# Patient Record
Sex: Female | Born: 1955 | Race: White | Hispanic: No | Marital: Married | Smoking: Never smoker
Health system: Southern US, Community
[De-identification: ages and names within clinical notes are randomized; demographics above are authoritative.]

## PROBLEM LIST (undated history)

## (undated) DIAGNOSIS — K219 Gastro-esophageal reflux disease without esophagitis: Secondary | ICD-10-CM

## (undated) DIAGNOSIS — E785 Hyperlipidemia, unspecified: Secondary | ICD-10-CM

## (undated) DIAGNOSIS — I10 Essential (primary) hypertension: Secondary | ICD-10-CM

## (undated) DIAGNOSIS — I639 Cerebral infarction, unspecified: Secondary | ICD-10-CM

## (undated) DIAGNOSIS — M1990 Unspecified osteoarthritis, unspecified site: Secondary | ICD-10-CM

## (undated) DIAGNOSIS — E079 Disorder of thyroid, unspecified: Secondary | ICD-10-CM

## (undated) HISTORY — PX: PLACEMENT OF BREAST IMPLANTS: SHX6334

## (undated) HISTORY — PX: ABDOMINAL HYSTERECTOMY: SHX81

---

## 2017-11-28 HISTORY — PX: ROBOTIC ASSISTED LAPAROSCOPIC SACROCOLPOPEXY: SHX5388

## 2018-09-11 ENCOUNTER — Emergency Department (HOSPITAL_COMMUNITY): Payer: No Typology Code available for payment source

## 2018-09-11 ENCOUNTER — Other Ambulatory Visit: Payer: Self-pay

## 2018-09-11 ENCOUNTER — Inpatient Hospital Stay (HOSPITAL_COMMUNITY)
Admission: EM | Admit: 2018-09-11 | Discharge: 2018-09-17 | DRG: 330 | Disposition: A | Discharge status: Home / Self Care | Payer: No Typology Code available for payment source | Attending: General Surgery | Admitting: General Surgery

## 2018-09-11 ENCOUNTER — Encounter (HOSPITAL_COMMUNITY)
Admission: EM | Disposition: A | Discharge status: Home / Self Care | Payer: Self-pay | Source: Home / Self Care | Attending: General Surgery

## 2018-09-11 ENCOUNTER — Encounter (HOSPITAL_COMMUNITY): Payer: Self-pay | Admitting: Emergency Medicine

## 2018-09-11 DIAGNOSIS — Z8249 Family history of ischemic heart disease and other diseases of the circulatory system: Secondary | ICD-10-CM

## 2018-09-11 DIAGNOSIS — K4030 Unilateral inguinal hernia, with obstruction, without gangrene, not specified as recurrent: Principal | ICD-10-CM | POA: Diagnosis present

## 2018-09-11 DIAGNOSIS — K567 Ileus, unspecified: Secondary | ICD-10-CM | POA: Diagnosis not present

## 2018-09-11 DIAGNOSIS — E039 Hypothyroidism, unspecified: Secondary | ICD-10-CM | POA: Diagnosis present

## 2018-09-11 DIAGNOSIS — Z881 Allergy status to other antibiotic agents status: Secondary | ICD-10-CM

## 2018-09-11 DIAGNOSIS — I10 Essential (primary) hypertension: Secondary | ICD-10-CM | POA: Diagnosis present

## 2018-09-11 DIAGNOSIS — K56609 Unspecified intestinal obstruction, unspecified as to partial versus complete obstruction: Secondary | ICD-10-CM | POA: Diagnosis not present

## 2018-09-11 DIAGNOSIS — R109 Unspecified abdominal pain: Secondary | ICD-10-CM

## 2018-09-11 DIAGNOSIS — K219 Gastro-esophageal reflux disease without esophagitis: Secondary | ICD-10-CM | POA: Diagnosis present

## 2018-09-11 DIAGNOSIS — Z4659 Encounter for fitting and adjustment of other gastrointestinal appliance and device: Secondary | ICD-10-CM

## 2018-09-11 DIAGNOSIS — Z808 Family history of malignant neoplasm of other organs or systems: Secondary | ICD-10-CM

## 2018-09-11 DIAGNOSIS — Z803 Family history of malignant neoplasm of breast: Secondary | ICD-10-CM

## 2018-09-11 DIAGNOSIS — Z882 Allergy status to sulfonamides status: Secondary | ICD-10-CM

## 2018-09-11 DIAGNOSIS — K4090 Unilateral inguinal hernia, without obstruction or gangrene, not specified as recurrent: Secondary | ICD-10-CM

## 2018-09-11 DIAGNOSIS — Z20828 Contact with and (suspected) exposure to other viral communicable diseases: Secondary | ICD-10-CM | POA: Diagnosis present

## 2018-09-11 DIAGNOSIS — Z8673 Personal history of transient ischemic attack (TIA), and cerebral infarction without residual deficits: Secondary | ICD-10-CM

## 2018-09-11 DIAGNOSIS — E876 Hypokalemia: Secondary | ICD-10-CM | POA: Diagnosis not present

## 2018-09-11 HISTORY — PX: BOWEL RESECTION: SHX1257

## 2018-09-11 HISTORY — PX: INGUINAL HERNIA REPAIR: SHX194

## 2018-09-11 HISTORY — DX: Unspecified osteoarthritis, unspecified site: M19.90

## 2018-09-11 HISTORY — DX: Essential (primary) hypertension: I10

## 2018-09-11 HISTORY — DX: Disorder of thyroid, unspecified: E07.9

## 2018-09-11 HISTORY — DX: Gastro-esophageal reflux disease without esophagitis: K21.9

## 2018-09-11 HISTORY — DX: Cerebral infarction, unspecified: I63.9

## 2018-09-11 LAB — URINALYSIS, ROUTINE W REFLEX MICROSCOPIC
Bacteria, UA: NONE SEEN
Bilirubin Urine: NEGATIVE
Glucose, UA: NEGATIVE mg/dL
Hgb urine dipstick: NEGATIVE
Ketones, ur: 80 mg/dL — AB
Leukocytes,Ua: NEGATIVE
Nitrite: NEGATIVE
Protein, ur: 30 mg/dL — AB
Specific Gravity, Urine: 1.026 (ref 1.005–1.030)
pH: 5 (ref 5.0–8.0)

## 2018-09-11 LAB — COMPREHENSIVE METABOLIC PANEL
ALT: 29 U/L (ref 0–44)
AST: 25 U/L (ref 15–41)
Albumin: 4.3 g/dL (ref 3.5–5.0)
Alkaline Phosphatase: 126 U/L (ref 38–126)
Anion gap: 14 (ref 5–15)
BUN: 21 mg/dL (ref 8–23)
CO2: 27 mmol/L (ref 22–32)
Calcium: 9.5 mg/dL (ref 8.9–10.3)
Chloride: 99 mmol/L (ref 98–111)
Creatinine, Ser: 0.47 mg/dL (ref 0.44–1.00)
GFR calc Af Amer: >60 mL/min (ref >60)
GFR calc non Af Amer: >60 mL/min (ref >60)
Glucose, Bld: 124 mg/dL — ABNORMAL HIGH (ref 70–99)
Potassium: 3.3 mmol/L — ABNORMAL LOW (ref 3.5–5.1)
Sodium: 140 mmol/L (ref 135–145)
Total Bilirubin: 0.9 mg/dL (ref 0.3–1.2)
Total Protein: 7.5 g/dL (ref 6.5–8.1)

## 2018-09-11 LAB — TROPONIN I (HIGH SENSITIVITY)
Troponin I (High Sensitivity): 4 ng/L (ref <18)
Troponin I (High Sensitivity): 4 ng/L (ref <18)

## 2018-09-11 LAB — DIFFERENTIAL
Abs Immature Granulocytes: 0.08 10*3/uL — ABNORMAL HIGH (ref 0.00–0.07)
Basophils Absolute: 0 10*3/uL (ref 0.0–0.1)
Basophils Relative: 0 %
Eosinophils Absolute: 0 10*3/uL (ref 0.0–0.5)
Eosinophils Relative: 0 %
Immature Granulocytes: 1 %
Lymphocytes Relative: 8 %
Lymphs Abs: 1 10*3/uL (ref 0.7–4.0)
Monocytes Absolute: 0.3 10*3/uL (ref 0.1–1.0)
Monocytes Relative: 3 %
Neutro Abs: 11.1 10*3/uL — ABNORMAL HIGH (ref 1.7–7.7)
Neutrophils Relative %: 88 %

## 2018-09-11 LAB — CBC
HCT: 48.9 % — ABNORMAL HIGH (ref 36.0–46.0)
Hemoglobin: 16 g/dL — ABNORMAL HIGH (ref 12.0–15.0)
MCH: 31.9 pg (ref 26.0–34.0)
MCHC: 32.7 g/dL (ref 30.0–36.0)
MCV: 97.4 fL (ref 80.0–100.0)
Platelets: 321 10*3/uL (ref 150–400)
RBC: 5.02 MIL/uL (ref 3.87–5.11)
RDW: 14 % (ref 11.5–15.5)
WBC: 12.2 10*3/uL — ABNORMAL HIGH (ref 4.0–10.5)
nRBC: 0 % (ref 0.0–0.2)

## 2018-09-11 LAB — SARS CORONAVIRUS 2 BY RT PCR (HOSPITAL ORDER, PERFORMED IN ~~LOC~~ HOSPITAL LAB): SARS Coronavirus 2: NEGATIVE

## 2018-09-11 LAB — LIPASE, BLOOD: Lipase: 17 U/L (ref 11–51)

## 2018-09-11 SURGERY — REPAIR, HERNIA, INGUINAL, INCARCERATED
Anesthesia: General | Site: Abdomen | Laterality: Left

## 2018-09-11 MED ORDER — BUPIVACAINE HCL (PF) 0.5 % IJ SOLN
INTRAMUSCULAR | Status: AC
  Filled 2018-09-11: qty 30

## 2018-09-11 MED ORDER — ONDANSETRON HCL 4 MG/2ML IJ SOLN
4.0000 mg | INTRAMUSCULAR | Status: AC | PRN
  Administered 2018-09-11 (×2): 4 mg via INTRAVENOUS
  Filled 2018-09-11 (×2): qty 2

## 2018-09-11 MED ORDER — IOHEXOL 300 MG/ML  SOLN
100.0000 mL | Freq: Once | INTRAMUSCULAR | Status: AC | PRN
  Administered 2018-09-11: 100 mL via INTRAVENOUS

## 2018-09-11 MED ORDER — MORPHINE SULFATE (PF) 4 MG/ML IV SOLN
4.0000 mg | INTRAVENOUS | Status: DC | PRN
  Administered 2018-09-11: 4 mg via INTRAVENOUS
  Filled 2018-09-11: qty 1

## 2018-09-11 MED ORDER — SUCCINYLCHOLINE CHLORIDE 200 MG/10ML IV SOSY
PREFILLED_SYRINGE | INTRAVENOUS | Status: AC
  Filled 2018-09-11: qty 10

## 2018-09-11 MED ORDER — BUPIVACAINE LIPOSOME 1.3 % IJ SUSP
INTRAMUSCULAR | Status: AC
  Filled 2018-09-11: qty 20

## 2018-09-11 MED ORDER — SODIUM CHLORIDE 0.9 % IV SOLN
INTRAVENOUS | Status: DC
  Administered 2018-09-11: 21:00:00 via INTRAVENOUS

## 2018-09-11 MED ORDER — PROPOFOL 10 MG/ML IV BOLUS
INTRAVENOUS | Status: AC
  Filled 2018-09-11: qty 20

## 2018-09-11 MED ORDER — ROCURONIUM BROMIDE 10 MG/ML (PF) SYRINGE
PREFILLED_SYRINGE | INTRAVENOUS | Status: AC
  Filled 2018-09-11: qty 10

## 2018-09-11 MED ORDER — FENTANYL CITRATE (PF) 100 MCG/2ML IJ SOLN
50.0000 ug | INTRAMUSCULAR | Status: DC | PRN
  Administered 2018-09-11: 50 ug via INTRAVENOUS

## 2018-09-11 MED ORDER — FENTANYL CITRATE (PF) 250 MCG/5ML IJ SOLN
INTRAMUSCULAR | Status: AC
  Filled 2018-09-11: qty 5

## 2018-09-11 MED ORDER — SODIUM CHLORIDE 0.9 % IV BOLUS
500.0000 mL | Freq: Once | INTRAVENOUS | Status: AC
  Administered 2018-09-11: 500 mL via INTRAVENOUS

## 2018-09-11 MED ORDER — FAMOTIDINE IN NACL 20-0.9 MG/50ML-% IV SOLN
20.0000 mg | Freq: Once | INTRAVENOUS | Status: AC
  Administered 2018-09-11: 20 mg via INTRAVENOUS
  Filled 2018-09-11: qty 50

## 2018-09-11 MED ORDER — ONDANSETRON HCL 4 MG/2ML IJ SOLN
INTRAMUSCULAR | Status: AC
  Filled 2018-09-11: qty 2

## 2018-09-11 MED ORDER — FENTANYL CITRATE (PF) 100 MCG/2ML IJ SOLN
50.0000 ug | Freq: Once | INTRAMUSCULAR | Status: DC
  Filled 2018-09-11: qty 2

## 2018-09-11 SURGICAL SUPPLY — 48 items
CELLS DAT CNTRL 66122 CELL SVR (MISCELLANEOUS) ×2 IMPLANT
CLOTH BEACON ORANGE TIMEOUT ST (SAFETY) ×2 IMPLANT
COVER LIGHT HANDLE STERIS (MISCELLANEOUS) ×4 IMPLANT
COVER WAND RF STERILE (DRAPES) ×2 IMPLANT
DRSG OPSITE POSTOP 4X8 (GAUZE/BANDAGES/DRESSINGS) ×2 IMPLANT
ELECT REM PT RETURN 9FT ADLT (ELECTROSURGICAL) ×4
ELECTRODE REM PT RTRN 9FT ADLT (ELECTROSURGICAL) IMPLANT
GLOVE BIO SURGEON STRL SZ 6.5 (GLOVE) ×1 IMPLANT
GLOVE BIO SURGEON STRL SZ7 (GLOVE) ×2 IMPLANT
GLOVE BIO SURGEONS STRL SZ 6.5 (GLOVE) ×1
GLOVE BIOGEL PI IND STRL 6.5 (GLOVE) IMPLANT
GLOVE BIOGEL PI IND STRL 7.0 (GLOVE) IMPLANT
GLOVE BIOGEL PI INDICATOR 6.5 (GLOVE) ×4
GLOVE BIOGEL PI INDICATOR 7.0 (GLOVE) ×8
GOWN STRL REUS W/TWL LRG LVL3 (GOWN DISPOSABLE) ×4 IMPLANT
INST SET MINOR GENERAL (KITS) ×2 IMPLANT
KIT TURNOVER KIT A (KITS) ×2 IMPLANT
LIGASURE IMPACT 36 18CM CVD LR (INSTRUMENTS) ×2 IMPLANT
MANIFOLD NEPTUNE II (INSTRUMENTS) ×2 IMPLANT
NDL HYPO 18GX1.5 BLUNT FILL (NEEDLE) IMPLANT
NDL HYPO 21X1.5 SAFETY (NEEDLE) IMPLANT
NEEDLE HYPO 18GX1.5 BLUNT FILL (NEEDLE) ×4 IMPLANT
NEEDLE HYPO 21X1.5 SAFETY (NEEDLE) ×4 IMPLANT
NEEDLE HYPO 22GX1.5 SAFETY (NEEDLE) ×2 IMPLANT
NS IRRIG 1000ML POUR BTL (IV SOLUTION) ×2 IMPLANT
PACK MINOR (CUSTOM PROCEDURE TRAY) ×2 IMPLANT
PAD ARMBOARD 7.5X6 YLW CONV (MISCELLANEOUS) ×2 IMPLANT
RELOAD LINEAR CUT PROX 55 BLUE (ENDOMECHANICALS) ×8 IMPLANT
RELOAD STAPLE 55 3.8 BLU REG (ENDOMECHANICALS) IMPLANT
RETRACTOR WND ALEXIS 18 MED (MISCELLANEOUS) IMPLANT
RTRCTR WOUND ALEXIS 18CM MED (MISCELLANEOUS) ×4
SET BASIN LINEN APH (SET/KITS/TRAYS/PACK) ×2 IMPLANT
STAPLER GUN LINEAR PROX 60 (STAPLE) ×2 IMPLANT
STAPLER PROXIMATE 55 BLUE (STAPLE) ×2 IMPLANT
STAPLER VISISTAT (STAPLE) ×2 IMPLANT
SUT ETHIBOND 0 MO6 C/R (SUTURE) ×2 IMPLANT
SUT PDS AB CT VIOLET #0 27IN (SUTURE) ×4 IMPLANT
SUT SILK 3 0 SH CR/8 (SUTURE) ×2 IMPLANT
SUT VIC AB 2-0 CT1 27 (SUTURE) ×2
SUT VIC AB 2-0 CT1 TAPERPNT 27 (SUTURE) IMPLANT
SUT VIC AB 3-0 SH 27 (SUTURE) ×2
SUT VIC AB 3-0 SH 27X BRD (SUTURE) IMPLANT
SUT VICRYL AB 3 0 TIES (SUTURE) ×2 IMPLANT
SYR 20ML LL LF (SYRINGE) ×4 IMPLANT
TOWEL OR 17X26 4PK STRL BLUE (TOWEL DISPOSABLE) ×2 IMPLANT
TRAY FOLEY W/BAG SLVR 14FR (SET/KITS/TRAYS/PACK) ×2 IMPLANT
TRAY FOLEY W/BAG SLVR 16FR (SET/KITS/TRAYS/PACK) ×2
TRAY FOLEY W/BAG SLVR 16FR ST (SET/KITS/TRAYS/PACK) IMPLANT

## 2018-09-11 NOTE — ED Triage Notes (Signed)
Nausea and all over abd pain started yesterday, reports vomiting that started this morning x 12. Took oral zofran at 1500 with no relief.

## 2018-09-11 NOTE — Anesthesia Preprocedure Evaluation (Signed)
Anesthesia Evaluation  Patient identified by MRN, date of birth, ID band Patient awake    Reviewed: Allergy & Precautions, NPO status , Patient's Chart, lab work & pertinent test results  Airway Mallampati: II  TM Distance: >3 FB Neck ROM: Full    Dental no notable dental hx. (+) Teeth Intact   Pulmonary neg pulmonary ROS,    Pulmonary exam normal breath sounds clear to auscultation       Cardiovascular Exercise Tolerance: Good hypertension, Pt. on medications negative cardio ROS Normal cardiovascular examI Rhythm:Regular Rate:Normal  States good ET , but limited by LBP and arthritis issues    Neuro/Psych Reports small CVA noted on scan in past -states was unaware until told it showed up on scan  CVA, No Residual Symptoms negative psych ROS   GI/Hepatic Neg liver ROS, GERD  Medicated and Controlled,  Endo/Other  Hypothyroidism   Renal/GU negative Renal ROS  negative genitourinary   Musculoskeletal  (+) Arthritis , Osteoarthritis,    Abdominal   Peds negative pediatric ROS (+)  Hematology negative hematology ROS (+)   Anesthesia Other Findings   Reproductive/Obstetrics negative OB ROS                             Anesthesia Physical Anesthesia Plan  ASA: II and emergent  Anesthesia Plan: General   Post-op Pain Management:    Induction: Intravenous  PONV Risk Score and Plan: 3 and Dexamethasone, Ondansetron, Treatment may vary due to age or medical condition and Promethazine  Airway Management Planned: Oral ETT  Additional Equipment:   Intra-op Plan:   Post-operative Plan: Extubation in OR  Informed Consent: I have reviewed the patients History and Physical, chart, labs and discussed the procedure including the risks, benefits and alternatives for the proposed anesthesia with the patient or authorized representative who has indicated his/her understanding and acceptance.      Dental advisory given  Plan Discussed with:   Anesthesia Plan Comments: (Plan Full PPE use  Plan GETA -RSI- wtp after Q&A  NGT in place, c/o nausea )        Anesthesia Quick Evaluation

## 2018-09-11 NOTE — ED Provider Notes (Signed)
[facility] EMERGENCY DEPARTMENT Provider Note   CSN: [id number] Arrival date & time: 09/11/18  1633     History   Chief Complaint Chief Complaint  Patient presents with   Nausea    HPI Claire Knight is a 63 y.o. female.     HPI  Pt was seen at 1915.  Per pt, c/o gradual onset and persistence of constant generalized abd "pain" since yesterday.  Has been associated with multiple intermittent episodes of N/V.  Describes the abd pain as "cramping." Pt endorses hx of similar abd pain intermittently for several years "but it usually goes away after a while," states she did not seek medical attention for it at those times so she does not know the diagnosis.  Denies diarrhea, no fevers, no back pain, no rash, no CP/SOB, no black or blood in stools or emesis.       Past Medical History:  Diagnosis Date   Arthritis    GERD (gastroesophageal reflux disease)    Hypertension    Stroke [facility])    found old stroke on MRI recently. no deficets    Thyroid disease     There are no active problems to display for this patient.   Past Surgical History:  Procedure Laterality Date   ABDOMINAL HYSTERECTOMY     SMALL INTESTINE SURGERY       OB History   No obstetric history on file.      Home Medications    Prior to Admission medications   Not on File    Family History No family history on file.  Social History Social History   Tobacco Use   Smoking status: Never Smoker   Smokeless tobacco: Never Used  Substance Use Topics   Alcohol use: Yes    Comment: occ   Drug use: Never     Allergies   Clindamycin/lincomycin and Sulfa antibiotics   Review of Systems Review of Systems ROS: Statement: All systems negative except as marked or noted in the HPI; Constitutional: Negative for fever and chills. ; ; Eyes: Negative for eye pain, redness and discharge. ; ; ENMT: Negative for ear pain, hoarseness, nasal congestion, sinus pressure and sore throat. ; ;  Cardiovascular: Negative for chest pain, palpitations, diaphoresis, dyspnea and peripheral edema. ; ; Respiratory: Negative for cough, wheezing and stridor. ; ; Gastrointestinal: +N/V, abd pain. Negative for diarrhea, blood in stool, hematemesis, jaundice and rectal bleeding. . ; ; Genitourinary: Negative for dysuria, flank pain and hematuria. ; ; Musculoskeletal: Negative for back pain and neck pain. Negative for swelling and trauma.; ; Skin: Negative for pruritus, rash, abrasions, blisters, bruising and skin lesion.; ; Neuro: Negative for headache, lightheadedness and neck stiffness. Negative for weakness, altered level of consciousness, altered mental status, extremity weakness, paresthesias, involuntary movement, seizure and syncope.       Physical Exam Updated Vital Signs BP (!) 150/88 (BP Location: Right Arm)    Pulse 86    Temp 98.3 F (36.8 C) (Oral)    Resp 19    Ht 5\' 2"  (1.575 m)    Wt 72.6 kg    SpO2 99%    BMI 29.26 kg/m   Physical Exam 1920: Physical examination:  Nursing notes reviewed; Vital signs and O2 SAT reviewed;  Constitutional: Well developed, Well nourished, Well hydrated, In no acute distress; Head:  Normocephalic, atraumatic; Eyes: EOMI, PERRL, No scleral icterus; ENMT: Mouth and pharynx normal, Mucous membranes moist; Neck: Supple, Full range of motion, No lymphadenopathy; Cardiovascular: Regular rate  and rhythm, No gallop; Respiratory: Breath sounds clear & equal bilaterally, No wheezes.  Speaking full sentences with ease, Normal respiratory effort/excursion; Chest: Nontender, Movement normal; Abdomen: Soft, +diffuse tenderness to palp. No rebound or guarding. Nondistended, Normal bowel sounds; Genitourinary: No CVA tenderness; Extremities: Peripheral pulses normal, No tenderness, No edema, No calf edema or asymmetry.; Neuro: AA&Ox3, Major CN grossly intact.  Speech clear. No gross focal motor or sensory deficits in extremities. Climbs on and off stretcher easily by herself.  Gait steady..; Skin: Color normal, Warm, Dry.   ED Treatments / Results  Labs (all labs ordered are listed, but only abnormal results are displayed)   EKG EKG Interpretation  Date/Time:  Friday September 11 2018 19:38:14 EDT Ventricular Rate:  86 PR Interval:    QRS Duration: 94 QT Interval:  374 QTC Calculation: 448 R Axis:   -7 Text Interpretation:  Sinus rhythm Low voltage, precordial leads Baseline wander No old tracing to compare Confirmed by Samuel JesterMcManus, Shelma Eiben [phone]) on 09/11/2018 7:46:40 PM   Radiology   Procedures Procedures (including critical care time)  Medications Ordered in ED Medications  ondansetron (ZOFRAN) injection 4 mg (has no administration in time range)  morphine 4 MG/ML injection 4 mg (has no administration in time range)  0.9 %  sodium chloride infusion (has no administration in time range)  sodium chloride 0.9 % bolus 500 mL (has no administration in time range)     Initial Impression / Assessment and Plan / ED Course  I have reviewed the triage vital signs and the nursing notes.  Pertinent labs & imaging results that were available during my care of the patient were reviewed by me and considered in my medical decision making (see chart for details).     MDM Reviewed: previous chart, nursing note and vitals Reviewed previous: labs and ECG Interpretation: labs, ECG, x-ray and CT scan   Results for orders placed or performed during the hospital encounter of 09/11/18  Lipase, blood  Result Value Ref Range   Lipase 17 11 - 51 U/L  Comprehensive metabolic panel  Result Value Ref Range   Sodium 140 135 - 145 mmol/L   Potassium 3.3 (L) 3.5 - 5.1 mmol/L   Chloride 99 98 - 111 mmol/L   CO2 27 22 - 32 mmol/L   Glucose, Bld 124 (H) 70 - 99 mg/dL   BUN 21 8 - 23 mg/dL   Creatinine, Ser 6.040.47 0.44 - 1.00 mg/dL   Calcium 9.5 8.9 - 54.010.3 mg/dL   Total Protein 7.5 6.5 - 8.1 g/dL   Albumin 4.3 3.5 - 5.0 g/dL   AST 25 15 - 41 U/L   ALT 29 0 - 44 U/L     Alkaline Phosphatase 126 38 - 126 U/L   Total Bilirubin 0.9 0.3 - 1.2 mg/dL   GFR calc non Af Amer >60 >60 mL/min   GFR calc Af Amer >60 >60 mL/min   Anion gap 14 5 - 15  CBC  Result Value Ref Range   WBC 12.2 (H) 4.0 - 10.5 K/uL   RBC 5.02 3.87 - 5.11 MIL/uL   Hemoglobin 16.0 (H) 12.0 - 15.0 g/dL   HCT 98.148.9 (H) 19.136.0 - 47.846.0 %   MCV 97.4 80.0 - 100.0 fL   MCH 31.9 26.0 - 34.0 pg   MCHC 32.7 30.0 - 36.0 g/dL   RDW 29.514.0 62.111.5 - 30.815.5 %   Platelets 321 150 - 400 K/uL   nRBC 0.0 0.0 - 0.2 %  Urinalysis,  Routine w reflex microscopic  Result Value Ref Range   Color, Urine AMBER (A) YELLOW   APPearance HAZY (A) CLEAR   Specific Gravity, Urine 1.026 1.005 - 1.030   pH 5.0 5.0 - 8.0   Glucose, UA NEGATIVE NEGATIVE mg/dL   Hgb urine dipstick NEGATIVE NEGATIVE   Bilirubin Urine NEGATIVE NEGATIVE   Ketones, ur 80 (A) NEGATIVE mg/dL   Protein, ur 30 (A) NEGATIVE mg/dL   Nitrite NEGATIVE NEGATIVE   Leukocytes,Ua NEGATIVE NEGATIVE   RBC / HPF 11-20 0 - 5 RBC/hpf   WBC, UA 0-5 0 - 5 WBC/hpf   Bacteria, UA NONE SEEN NONE SEEN   Squamous Epithelial / LPF 0-5 0 - 5   Mucus PRESENT   Differential  Result Value Ref Range   Neutrophils Relative % 88 %   Neutro Abs 11.1 (H) 1.7 - 7.7 K/uL   Lymphocytes Relative 8 %   Lymphs Abs 1.0 0.7 - 4.0 K/uL   Monocytes Relative 3 %   Monocytes Absolute 0.3 0.1 - 1.0 K/uL   Eosinophils Relative 0 %   Eosinophils Absolute 0.0 0.0 - 0.5 K/uL   Basophils Relative 0 %   Basophils Absolute 0.0 0.0 - 0.1 K/uL   Immature Granulocytes 1 %   Abs Immature Granulocytes 0.08 (H) 0.00 - 0.07 K/uL  Troponin I (High Sensitivity)  Result Value Ref Range   Troponin I (High Sensitivity) 4 <18 ng/L   Ct Abdomen Pelvis W Contrast Result Date: 09/11/2018 CLINICAL DATA:  Abdominal pain EXAM: CT ABDOMEN AND PELVIS WITH CONTRAST TECHNIQUE: Multidetector CT imaging of the abdomen and pelvis was performed using the standard protocol following bolus administration of  intravenous contrast. CONTRAST:  100mL OMNIPAQUE IOHEXOL 300 MG/ML  SOLN COMPARISON:  None. FINDINGS: Lower chest: Lung bases demonstrate no acute consolidation or effusion. Heart size within normal limits. Bilateral breast implants, diffusely calcified on the left. Large para esophageal hiatal hernia. Hepatobiliary: No focal liver abnormality is seen. No gallstones, gallbladder wall thickening, or biliary dilatation. Pancreas: Unremarkable. No pancreatic ductal dilatation or surrounding inflammatory changes. Spleen: Normal in size without focal abnormality. Adrenals/Urinary Tract: Adrenal glands are unremarkable. Kidneys are normal, without renal calculi, focal lesion, or hydronephrosis. Bladder is unremarkable. Stomach/Bowel: Dilated fluid-filled loops of mid small bowel, measuring up to 3 cm. Transition point related to a left inguinal hernia containing mesentery and segment of small bowel. There is edema and fluid in the hernia sac. The bowel distal to the hernia is decompressed. Sigmoid colon diverticula without acute inflammatory change Vascular/Lymphatic: Nonaneurysmal aorta. Mild aortic atherosclerosis. No significantly enlarged lymph nodes. Reproductive: Status post hysterectomy. No adnexal masses. Other: Negative for free air or free fluid Musculoskeletal: Grade 1 anterolisthesis L4 on L5. Degenerative changes. No acute osseous abnormality IMPRESSION: 1. Findings consistent with mechanical small bowel obstruction, secondary to an incarcerated left inguinal hernia. 2. Large hiatal hernia 3. Sigmoid colon diverticular disease without acute inflammatory change Electronically Signed   By: Jasmine PangKim  Fujinaga M.D.   On: 09/11/2018 21:33   Dg Chest Port 1 View Result Date: 09/11/2018 CLINICAL DATA:  Abdomen pain with nausea and vomiting EXAM: PORTABLE CHEST 1 VIEW COMPARISON:  None. FINDINGS: The heart size and mediastinal contours are within normal limits. Both lungs are clear. The visualized skeletal structures  are unremarkable. IMPRESSION: No active disease. Electronically Signed   By: Jasmine PangKim  Fujinaga M.D.   On: 09/11/2018 20:17   Claire Knight was evaluated in Emergency Department on 09/11/2018 for the symptoms described in the  history of present illness. She was evaluated in the context of the global COVID-19 pandemic, which necessitated consideration that the patient might be at risk for infection with the SARS-CoV-2 virus that causes COVID-19. Institutional protocols and algorithms that pertain to the evaluation of patients at risk for COVID-19 are in a state of rapid change based on information released by regulatory bodies including the CDC and federal and state organizations. These policies and algorithms were followed during the patient's care in the ED.   2155:  IV morphine and fentanyl given for pain. No N/V while in the ED.  CT as above. Attempted to reduce hernia with gentle pressure: unable without pt yelling and pushing my hand away (despite meds and ice pack). Pt now states "that's been sticking out for years" and she "never pushes it in" and she has never sought medical attention for it.  T/C returned from General Surgery Dr. Henreitta LeberBridges, case discussed, including:  HPI, pertinent PM/SHx, VS/PE, dx testing, ED course and treatment:  requests to place NGT, obtain COVID testing and will come to ED for evaluation/likely take pt to OR.      Final Clinical Impressions(s) / ED Diagnoses   Final diagnoses:  Abdominal pain    ED Discharge Orders    None       Samuel JesterMcManus, Lanayah Gartley, DO 09/16/18 1255

## 2018-09-11 NOTE — H&P (Signed)
[facility] History and Physical  Reason for Referral: Incarcerated Left inguinal hernia with large bowel obstruction  Referring Physician:  Dr. Thurnell Garbe   Chief Complaint    Nausea      Claire Knight is a 63 y.o. female.  HPI: Claire Knight is a 63 yo female with GERD, HTN, and otherwise relatively healthy who comes in with lower abdominal, left groin pain with associated nausea/ vomiting. She reports that she has had some similar episodes in the past but these resolve. She has never felt a knot or bulge that she can recall. She underwent robotic sacrocolopexy in November 2019 at [facility] and did well after this surgery.   The ED attempted to reduce the hernia with pain medication, but this was not successful.  Daughter Claire Knight is at her bedside.   Past Medical History:  Diagnosis Date  . Arthritis   . GERD (gastroesophageal reflux disease)   . Hypertension   . Stroke [facility])    found old stroke on MRI recently. no deficets   . Thyroid disease     Past Surgical History:  Procedure Laterality Date  . ABDOMINAL HYSTERECTOMY    . PLACEMENT OF BREAST IMPLANTS    . ROBOTIC ASSISTED LAPAROSCOPIC SACROCOLPOPEXY  11/2017    Family History  Problem Relation Age of Onset  . CAD Father   . Heart failure Father   . Breast cancer Sister   . Brain cancer Brother     Social History   Tobacco Use  . Smoking status: Never Smoker  . Smokeless tobacco: Never Used  Substance Use Topics  . Alcohol use: Yes    Comment: occ  . Drug use: Never    Medications:  I have reviewed the patient's current medications. Prior to Admission: (Not in a hospital admission)  Scheduled: . fentaNYL (SUBLIMAZE) injection  50 mcg Intravenous Once   Continuous: . sodium chloride 100 mL/hr at 09/11/18 2030   LFY:BOFBPZWC (SUBLIMAZE) injection, morphine injection, ondansetron  Allergies  Allergen Reactions  . Clindamycin/Lincomycin Other (See Comments)    Body aches   . Sulfa  Antibiotics Other (See Comments)    Hallucinations    ROS:  A comprehensive review of systems was negative except for: Gastrointestinal: positive for abdominal pain, nausea and vomiting  Blood pressure 139/83, pulse 83, temperature 98.3 F (36.8 C), temperature source Oral, resp. rate 11, height 5\' 2"  (1.575 m), weight 72.6 kg, SpO2 96 %. Physical Exam Vitals signs reviewed.  HENT:     Head: Normocephalic.     Nose: Nose normal.     Mouth/Throat:     Mouth: Mucous membranes are dry.  Eyes:     Extraocular Movements: Extraocular movements intact.     Pupils: Pupils are equal, round, and reactive to light.  Neck:     Musculoskeletal: Normal range of motion.  Cardiovascular:     Rate and Rhythm: Normal rate.  Pulmonary:     Effort: Pulmonary effort is normal.  Abdominal:     General: There is distension.     Palpations: Abdomen is soft.     Tenderness: There is abdominal tenderness.     Hernia: A hernia is present. Hernia is present in the left inguinal area.     Comments: Hard tender area over left inguinal region  Musculoskeletal:        General: No swelling.  Skin:    General: Skin is warm and dry.  Neurological:     General: No focal deficit present.  Mental Status: She is alert and oriented to person, place, and time.  Psychiatric:        Mood and Affect: Mood normal.        Behavior: Behavior normal.        Thought Content: Thought content normal.        Judgment: Judgment normal.     Results: Results for orders placed or performed during the hospital encounter of 09/11/18 (from the past 48 hour(s))  Lipase, blood     Status: None   Collection Time: 09/11/18  6:30 PM  Result Value Ref Range   Lipase 17 11 - 51 U/L    Comment: Performed at [facility], [address] [place], [place] [address]  Comprehensive metabolic panel     Status: Abnormal   Collection Time: 09/11/18  6:30 PM  Result Value Ref Range   Sodium 140 135 - 145 mmol/L   Potassium 3.3 (L)  3.5 - 5.1 mmol/L   Chloride 99 98 - 111 mmol/L   CO2 27 22 - 32 mmol/L   Glucose, Bld 124 (H) 70 - 99 mg/dL   BUN 21 8 - 23 mg/dL   Creatinine, Ser 1.910.47 0.44 - 1.00 mg/dL   Calcium 9.5 8.9 - 47.810.3 mg/dL   Total Protein 7.5 6.5 - 8.1 g/dL   Albumin 4.3 3.5 - 5.0 g/dL   AST 25 15 - 41 U/L   ALT 29 0 - 44 U/L   Alkaline Phosphatase 126 38 - 126 U/L   Total Bilirubin 0.9 0.3 - 1.2 mg/dL   GFR calc non Af Amer >60 >60 mL/min   GFR calc Af Amer >60 >60 mL/min   Anion gap 14 5 - 15    Comment: Performed at [facility], [address] [place], [place] [address]  CBC     Status: Abnormal   Collection Time: 09/11/18  6:30 PM  Result Value Ref Range   WBC 12.2 (H) 4.0 - 10.5 K/uL   RBC 5.02 3.87 - 5.11 MIL/uL   Hemoglobin 16.0 (H) 12.0 - 15.0 g/dL   HCT 13.048.9 (H) 86.536.0 - 78.446.0 %   MCV 97.4 80.0 - 100.0 fL   MCH 31.9 26.0 - 34.0 pg   MCHC 32.7 30.0 - 36.0 g/dL   RDW 69.614.0 29.511.5 - 28.415.5 %   Platelets 321 150 - 400 K/uL   nRBC 0.0 0.0 - 0.2 %    Comment: Performed at [facility], [address] [place], [place] [address]  Differential     Status: Abnormal   Collection Time: 09/11/18  6:30 PM  Result Value Ref Range   Neutrophils Relative % 88 %   Neutro Abs 11.1 (H) 1.7 - 7.7 K/uL   Lymphocytes Relative 8 %   Lymphs Abs 1.0 0.7 - 4.0 K/uL   Monocytes Relative 3 %   Monocytes Absolute 0.3 0.1 - 1.0 K/uL   Eosinophils Relative 0 %   Eosinophils Absolute 0.0 0.0 - 0.5 K/uL   Basophils Relative 0 %   Basophils Absolute 0.0 0.0 - 0.1 K/uL   Immature Granulocytes 1 %   Abs Immature Granulocytes 0.08 (H) 0.00 - 0.07 K/uL    Comment: Performed at [facility], [address] [place], [place] [address]  Troponin I (High Sensitivity)     Status: None   Collection Time: 09/11/18  6:30 PM  Result Value Ref Range   Troponin I (High Sensitivity) 4 <18 ng/L    Comment: (NOTE) Elevated high sensitivity  troponin I (hsTnI) values and significant  changes across serial measurements may suggest ACS but  many other  chronic and acute conditions are known to elevate hsTnI results.  Refer to the "Links" section for chest pain algorithms and additional  guidance. Performed at [facility], [address] [place], [place] [address]   Urinalysis, Routine w reflex microscopic     Status: Abnormal   Collection Time: 09/11/18  7:45 PM  Result Value Ref Range   Color, Urine AMBER (A) YELLOW    Comment: BIOCHEMICALS MAY BE AFFECTED BY COLOR   APPearance HAZY (A) CLEAR   Specific Gravity, Urine 1.026 1.005 - 1.030   pH 5.0 5.0 - 8.0   Glucose, UA NEGATIVE NEGATIVE mg/dL   Hgb urine dipstick NEGATIVE NEGATIVE   Bilirubin Urine NEGATIVE NEGATIVE   Ketones, ur 80 (A) NEGATIVE mg/dL   Protein, ur 30 (A) NEGATIVE mg/dL   Nitrite NEGATIVE NEGATIVE   Leukocytes,Ua NEGATIVE NEGATIVE   RBC / HPF 11-20 0 - 5 RBC/hpf   WBC, UA 0-5 0 - 5 WBC/hpf   Bacteria, UA NONE SEEN NONE SEEN   Squamous Epithelial / LPF 0-5 0 - 5   Mucus PRESENT     Comment: Performed at [facility], [address] [place], [place] [address]  Troponin I (High Sensitivity)     Status: None   Collection Time: 09/11/18  9:30 PM  Result Value Ref Range   Troponin I (High Sensitivity) 4 <18 ng/L    Comment: (NOTE) Elevated high sensitivity troponin I (hsTnI) values and significant  changes across serial measurements may suggest ACS but many other  chronic and acute conditions are known to elevate hsTnI results.  Refer to the "Links" section for chest pain algorithms and additional  guidance. Performed at [facility], [address] [place], [place] [address]   SARS Coronavirus 2 [facility](Hospital order, Performed in [facility] hospital lab) Nasopharyngeal Nasopharyngeal Swab     Status: None   Collection Time: 09/11/18 10:04 PM   Specimen: Nasopharyngeal Swab  Result Value Ref Range   SARS Coronavirus 2 NEGATIVE NEGATIVE    Comment: (NOTE) If result is NEGATIVE SARS-CoV-2 target nucleic acids are NOT DETECTED. The SARS-CoV-2 RNA is  generally detectable in upper and lower  respiratory specimens during the acute phase of infection. The lowest  concentration of SARS-CoV-2 viral copies this assay can detect is 250  copies / mL. A negative result does not preclude SARS-CoV-2 infection  and should not be used as the sole basis for treatment or other  patient management decisions.  A negative result may occur with  improper specimen collection / handling, submission of specimen other  than nasopharyngeal swab, presence of viral mutation(s) within the  areas targeted by this assay, and inadequate number of viral copies  (<250 copies / mL). A negative result must be combined with clinical  observations, patient history, and epidemiological information. If result is POSITIVE SARS-CoV-2 target nucleic acids are DETECTED. The SARS-CoV-2 RNA is generally detectable in upper and lower  respiratory specimens dur ing the acute phase of infection.  Positive  results are indicative of active infection with SARS-CoV-2.  Clinical  correlation with patient history and other diagnostic information is  necessary to determine patient infection status.  Positive results do  not rule out bacterial infection or co-infection with other viruses. If result is PRESUMPTIVE POSTIVE SARS-CoV-2 nucleic acids MAY BE PRESENT.   A presumptive positive result was obtained on the submitted specimen  and confirmed on repeat testing.  While 2019 novel coronavirus  (SARS-CoV-2) nucleic acids may be present in the submitted sample  additional confirmatory testing may be necessary for epidemiological  and / or clinical management purposes  to differentiate between  SARS-CoV-2 and other Sarbecovirus currently known to infect humans.  If clinically indicated additional testing with an alternate test  methodology [phone]([id number]) is advised. The SARS-CoV-2 RNA is generally  detectable in upper and lower respiratory sp ecimens during the acute  phase of  infection. The expected result is Negative. Fact Sheet for Patients:  BoilerBrush.com.cyhttps://www.fda.gov/media/136312/download Fact Sheet for Healthcare Providers: https://pope.com/https://www.fda.gov/media/136313/download This test is not yet approved or cleared by the [place] FDA and has been authorized for detection and/or diagnosis of SARS-CoV-2 by FDA under an Emergency Use Authorization (EUA).  This EUA will remain in effect (meaning this test can be used) for the duration of the COVID-19 declaration under Section 564(b)(1) of the Act, 21 U.S.C. section 360bbb-3(b)(1), unless the authorization is terminated or revoked sooner. Performed at [facility], [address] [place], [place] [address]    Personally reviewed- left inguinal hernia with dilated proximal bowel, no free fluid or signs of ischemia on CT, no free air, looks possibly more medial like a direct inguinal hernia  Ct Abdomen Pelvis W Contrast  Result Date: 09/11/2018 CLINICAL DATA:  Abdominal pain EXAM: CT ABDOMEN AND PELVIS WITH CONTRAST TECHNIQUE: Multidetector CT imaging of the abdomen and pelvis was performed using the standard protocol following bolus administration of intravenous contrast. CONTRAST:  100mL OMNIPAQUE IOHEXOL 300 MG/ML  SOLN COMPARISON:  None. FINDINGS: Lower chest: Lung bases demonstrate no acute consolidation or effusion. Heart size within normal limits. Bilateral breast implants, diffusely calcified on the left. Large para esophageal hiatal hernia. Hepatobiliary: No focal liver abnormality is seen. No gallstones, gallbladder wall thickening, or biliary dilatation. Pancreas: Unremarkable. No pancreatic ductal dilatation or surrounding inflammatory changes. Spleen: Normal in size without focal abnormality. Adrenals/Urinary Tract: Adrenal glands are unremarkable. Kidneys are normal, without renal calculi, focal lesion, or hydronephrosis. Bladder is unremarkable. Stomach/Bowel: Dilated fluid-filled loops of mid small bowel, measuring  up to 3 cm. Transition point related to a left inguinal hernia containing mesentery and segment of small bowel. There is edema and fluid in the hernia sac. The bowel distal to the hernia is decompressed. Sigmoid colon diverticula without acute inflammatory change Vascular/Lymphatic: Nonaneurysmal aorta. Mild aortic atherosclerosis. No significantly enlarged lymph nodes. Reproductive: Status post hysterectomy. No adnexal masses. Other: Negative for free air or free fluid Musculoskeletal: Grade 1 anterolisthesis L4 on L5. Degenerative changes. No acute osseous abnormality IMPRESSION: 1. Findings consistent with mechanical small bowel obstruction, secondary to an incarcerated left inguinal hernia. 2. Large hiatal hernia 3. Sigmoid colon diverticular disease without acute inflammatory change Electronically Signed   By: Jasmine PangKim  Fujinaga M.D.   On: 09/11/2018 21:33   Dg Chest Port 1 View  Result Date: 09/11/2018 CLINICAL DATA:  Abdomen pain with nausea and vomiting EXAM: PORTABLE CHEST 1 VIEW COMPARISON:  None. FINDINGS: The heart size and mediastinal contours are within normal limits. Both lungs are clear. The visualized skeletal structures are unremarkable. IMPRESSION: No active disease. Electronically Signed   By: Jasmine PangKim  Fujinaga M.D.   On: 09/11/2018 20:17    Assessment & Plan:  Claire Knight is a 63 y.o. female with an incarcerated possibly strangulated left inguinal hernia causing a small bowel obstruction. The patient is tender with a leukocytosis and is at risk for strangulated bowel as this has been  out since yesterday.   -OR for left inguinal hernia repair with possible mesh, may also need to perform a midline ex lap to perform small bowel resection and repair if the bowel looks ischemic  -Discussed risk of bleeding, infection, recurrence of hernia, reasons and times we use mesh and do not, risk of small bowel resection and need for additional midline incision -Discussed post operative course being  directly related to what we find in surgery -Discussed potential sepsis and need for ICU admission, possible need to transfer -COVID negative  -CXR  Negative ,EKG sinus rhythm   All questions were answered to the satisfaction of the patient and family.  Claire Knight 09/11/2018, 11:27 PM

## 2018-09-12 ENCOUNTER — Emergency Department (HOSPITAL_COMMUNITY): Payer: No Typology Code available for payment source | Admitting: Anesthesiology

## 2018-09-12 DIAGNOSIS — Z8249 Family history of ischemic heart disease and other diseases of the circulatory system: Secondary | ICD-10-CM | POA: Diagnosis not present

## 2018-09-12 DIAGNOSIS — K219 Gastro-esophageal reflux disease without esophagitis: Secondary | ICD-10-CM | POA: Diagnosis present

## 2018-09-12 DIAGNOSIS — E876 Hypokalemia: Secondary | ICD-10-CM | POA: Diagnosis not present

## 2018-09-12 DIAGNOSIS — Z881 Allergy status to other antibiotic agents status: Secondary | ICD-10-CM | POA: Diagnosis not present

## 2018-09-12 DIAGNOSIS — K4030 Unilateral inguinal hernia, with obstruction, without gangrene, not specified as recurrent: Secondary | ICD-10-CM | POA: Diagnosis present

## 2018-09-12 DIAGNOSIS — Z20828 Contact with and (suspected) exposure to other viral communicable diseases: Secondary | ICD-10-CM | POA: Diagnosis present

## 2018-09-12 DIAGNOSIS — Z803 Family history of malignant neoplasm of breast: Secondary | ICD-10-CM | POA: Diagnosis not present

## 2018-09-12 DIAGNOSIS — E039 Hypothyroidism, unspecified: Secondary | ICD-10-CM | POA: Diagnosis present

## 2018-09-12 DIAGNOSIS — Z808 Family history of malignant neoplasm of other organs or systems: Secondary | ICD-10-CM | POA: Diagnosis not present

## 2018-09-12 DIAGNOSIS — K56609 Unspecified intestinal obstruction, unspecified as to partial versus complete obstruction: Secondary | ICD-10-CM | POA: Diagnosis not present

## 2018-09-12 DIAGNOSIS — R109 Unspecified abdominal pain: Secondary | ICD-10-CM | POA: Diagnosis present

## 2018-09-12 DIAGNOSIS — K567 Ileus, unspecified: Secondary | ICD-10-CM | POA: Diagnosis not present

## 2018-09-12 DIAGNOSIS — I10 Essential (primary) hypertension: Secondary | ICD-10-CM | POA: Diagnosis present

## 2018-09-12 DIAGNOSIS — Z8673 Personal history of transient ischemic attack (TIA), and cerebral infarction without residual deficits: Secondary | ICD-10-CM | POA: Diagnosis not present

## 2018-09-12 DIAGNOSIS — Z882 Allergy status to sulfonamides status: Secondary | ICD-10-CM | POA: Diagnosis not present

## 2018-09-12 LAB — BASIC METABOLIC PANEL
Anion gap: 12 (ref 5–15)
BUN: 21 mg/dL (ref 8–23)
CO2: 23 mmol/L (ref 22–32)
Calcium: 8.6 mg/dL — ABNORMAL LOW (ref 8.9–10.3)
Chloride: 105 mmol/L (ref 98–111)
Creatinine, Ser: 0.54 mg/dL (ref 0.44–1.00)
GFR calc Af Amer: >60 mL/min (ref >60)
GFR calc non Af Amer: >60 mL/min (ref >60)
Glucose, Bld: 161 mg/dL — ABNORMAL HIGH (ref 70–99)
Potassium: 3.1 mmol/L — ABNORMAL LOW (ref 3.5–5.1)
Sodium: 140 mmol/L (ref 135–145)

## 2018-09-12 LAB — CBC WITH DIFFERENTIAL/PLATELET
Abs Immature Granulocytes: 0.04 10*3/uL (ref 0.00–0.07)
Basophils Absolute: 0 10*3/uL (ref 0.0–0.1)
Basophils Relative: 0 %
Eosinophils Absolute: 0 10*3/uL (ref 0.0–0.5)
Eosinophils Relative: 0 %
HCT: 44.3 % (ref 36.0–46.0)
Hemoglobin: 14.4 g/dL (ref 12.0–15.0)
Immature Granulocytes: 0 %
Lymphocytes Relative: 5 %
Lymphs Abs: 0.6 10*3/uL — ABNORMAL LOW (ref 0.7–4.0)
MCH: 31.6 pg (ref 26.0–34.0)
MCHC: 32.5 g/dL (ref 30.0–36.0)
MCV: 97.4 fL (ref 80.0–100.0)
Monocytes Absolute: 0.7 10*3/uL (ref 0.1–1.0)
Monocytes Relative: 6 %
Neutro Abs: 11.1 10*3/uL — ABNORMAL HIGH (ref 1.7–7.7)
Neutrophils Relative %: 89 %
Platelets: 275 10*3/uL (ref 150–400)
RBC: 4.55 MIL/uL (ref 3.87–5.11)
RDW: 14.2 % (ref 11.5–15.5)
WBC: 12.5 10*3/uL — ABNORMAL HIGH (ref 4.0–10.5)
nRBC: 0 % (ref 0.0–0.2)

## 2018-09-12 LAB — MAGNESIUM: Magnesium: 1.7 mg/dL (ref 1.7–2.4)

## 2018-09-12 LAB — PHOSPHORUS: Phosphorus: 3.4 mg/dL (ref 2.5–4.6)

## 2018-09-12 MED ORDER — SUGAMMADEX SODIUM 500 MG/5ML IV SOLN
INTRAVENOUS | Status: DC | PRN
  Administered 2018-09-12: 145.2 mg via INTRAVENOUS

## 2018-09-12 MED ORDER — LACTATED RINGERS IV SOLN
INTRAVENOUS | Status: DC
  Administered 2018-09-12: via INTRAVENOUS

## 2018-09-12 MED ORDER — POTASSIUM CHLORIDE 10 MEQ/100ML IV SOLN
10.0000 meq | INTRAVENOUS | Status: AC
  Administered 2018-09-12 (×5): 10 meq via INTRAVENOUS
  Filled 2018-09-12 (×5): qty 100

## 2018-09-12 MED ORDER — ONDANSETRON HCL 4 MG/2ML IJ SOLN
4.0000 mg | Freq: Four times a day (QID) | INTRAMUSCULAR | Status: DC | PRN
  Administered 2018-09-12: 4 mg via INTRAVENOUS
  Filled 2018-09-12: qty 2

## 2018-09-12 MED ORDER — ROCURONIUM BROMIDE 100 MG/10ML IV SOLN
INTRAVENOUS | Status: DC | PRN
  Administered 2018-09-12: 40 mg via INTRAVENOUS
  Administered 2018-09-12: 10 mg via INTRAVENOUS

## 2018-09-12 MED ORDER — ONDANSETRON 4 MG PO TBDP: 4.0000 mg | ORAL_TABLET | Freq: Four times a day (QID) | ORAL | Status: DC | PRN

## 2018-09-12 MED ORDER — SODIUM CHLORIDE 0.9 % IV SOLN
2.0000 g | INTRAVENOUS | Status: AC
  Administered 2018-09-12: 2 g via INTRAVENOUS

## 2018-09-12 MED ORDER — SODIUM CHLORIDE 0.9 % IR SOLN
Status: DC | PRN
  Administered 2018-09-12: 1000 mL

## 2018-09-12 MED ORDER — DEXAMETHASONE SODIUM PHOSPHATE 10 MG/ML IJ SOLN
INTRAMUSCULAR | Status: DC | PRN
  Administered 2018-09-12: 4 mg via INTRAVENOUS

## 2018-09-12 MED ORDER — SUCCINYLCHOLINE CHLORIDE 20 MG/ML IJ SOLN
INTRAMUSCULAR | Status: DC | PRN
  Administered 2018-09-12: 8 mg via INTRAVENOUS

## 2018-09-12 MED ORDER — BUPIVACAINE LIPOSOME 1.3 % IJ SUSP
INTRAMUSCULAR | Status: DC | PRN
  Administered 2018-09-12: 20 mL

## 2018-09-12 MED ORDER — ONDANSETRON HCL 4 MG/2ML IJ SOLN
INTRAMUSCULAR | Status: DC | PRN
  Administered 2018-09-12: 4 mg via INTRAVENOUS

## 2018-09-12 MED ORDER — PROPOFOL 10 MG/ML IV BOLUS
INTRAVENOUS | Status: DC | PRN
  Administered 2018-09-12: 60 mg via INTRAVENOUS
  Administered 2018-09-12: 140 mg via INTRAVENOUS

## 2018-09-12 MED ORDER — PROMETHAZINE HCL 25 MG/ML IJ SOLN: 6.2500 mg | INTRAMUSCULAR | Status: DC | PRN

## 2018-09-12 MED ORDER — DIPHENHYDRAMINE HCL 12.5 MG/5ML PO ELIX: 12.5000 mg | ORAL_SOLUTION | Freq: Four times a day (QID) | ORAL | Status: DC | PRN

## 2018-09-12 MED ORDER — SODIUM CHLORIDE 0.9 % IV SOLN
INTRAVENOUS | Status: AC
  Filled 2018-09-12: qty 2

## 2018-09-12 MED ORDER — HYDROMORPHONE HCL 1 MG/ML IJ SOLN: 0.2500 mg | INTRAMUSCULAR | Status: DC | PRN

## 2018-09-12 MED ORDER — MAGNESIUM SULFATE 2 GM/50ML IV SOLN
2.0000 g | Freq: Once | INTRAVENOUS | Status: AC
  Administered 2018-09-12: 2 g via INTRAVENOUS
  Filled 2018-09-12: qty 50

## 2018-09-12 MED ORDER — FENTANYL CITRATE (PF) 100 MCG/2ML IJ SOLN
INTRAMUSCULAR | Status: AC
  Filled 2018-09-12: qty 2

## 2018-09-12 MED ORDER — LACTATED RINGERS IV SOLN
INTRAVENOUS | Status: DC
  Administered 2018-09-12 – 2018-09-13 (×4): via INTRAVENOUS

## 2018-09-12 MED ORDER — PROMETHAZINE HCL 25 MG/ML IJ SOLN
25.0000 mg | Freq: Four times a day (QID) | INTRAMUSCULAR | Status: DC | PRN
  Administered 2018-09-12 (×2): 25 mg via INTRAVENOUS
  Filled 2018-09-12 (×2): qty 1

## 2018-09-12 MED ORDER — CHLORHEXIDINE GLUCONATE CLOTH 2 % EX PADS: 6.0000 | MEDICATED_PAD | Freq: Once | CUTANEOUS | Status: DC

## 2018-09-12 MED ORDER — MIDAZOLAM HCL 2 MG/2ML IJ SOLN: 0.5000 mg | Freq: Once | INTRAMUSCULAR | Status: DC | PRN

## 2018-09-12 MED ORDER — MORPHINE SULFATE (PF) 2 MG/ML IV SOLN
2.0000 mg | INTRAVENOUS | Status: DC | PRN
  Administered 2018-09-12 – 2018-09-16 (×11): 2 mg via INTRAVENOUS
  Filled 2018-09-12 (×12): qty 1

## 2018-09-12 MED ORDER — PANTOPRAZOLE SODIUM 40 MG IV SOLR
40.0000 mg | Freq: Every day | INTRAVENOUS | Status: DC
  Administered 2018-09-12 – 2018-09-13 (×2): 40 mg via INTRAVENOUS
  Filled 2018-09-12 (×2): qty 40

## 2018-09-12 MED ORDER — FENTANYL CITRATE (PF) 100 MCG/2ML IJ SOLN
INTRAMUSCULAR | Status: DC | PRN
  Administered 2018-09-12: 100 ug via INTRAVENOUS
  Administered 2018-09-12: 25 ug via INTRAVENOUS
  Administered 2018-09-12 (×3): 50 ug via INTRAVENOUS
  Administered 2018-09-12: 75 ug via INTRAVENOUS

## 2018-09-12 MED ORDER — METOPROLOL TARTRATE 5 MG/5ML IV SOLN: 5.0000 mg | Freq: Four times a day (QID) | INTRAVENOUS | Status: DC | PRN

## 2018-09-12 MED ORDER — DIPHENHYDRAMINE HCL 50 MG/ML IJ SOLN: 12.5000 mg | Freq: Four times a day (QID) | INTRAMUSCULAR | Status: DC | PRN

## 2018-09-12 MED ORDER — HYDROCODONE-ACETAMINOPHEN 7.5-325 MG PO TABS: 1.0000 | ORAL_TABLET | Freq: Once | ORAL | Status: DC | PRN

## 2018-09-12 MED ORDER — HEPARIN SODIUM (PORCINE) 5000 UNIT/ML IJ SOLN
5000.0000 [IU] | Freq: Three times a day (TID) | INTRAMUSCULAR | Status: DC
  Administered 2018-09-12 – 2018-09-17 (×14): 5000 [IU] via SUBCUTANEOUS
  Filled 2018-09-12 (×15): qty 1

## 2018-09-12 NOTE — Discharge Instructions (Signed)
Discharge Open Abdominal Surgery Instructions:  Common Complaints: Pain at the incision site is common. This will improve with time. Take your pain medications as described below. Some nausea is common and poor appetite. The main goal is to stay hydrated the first few days after surgery.   Diet/ Activity: Diet as tolerated. You have started and tolerated a diet in the hospital, and should continue to increase what you are able to eat.   You may not have a large appetite, but it is important to stay hydrated. Drink 64 ounces of water a day. Your appetite will return with time.  Keep a dry dressing in place over your staples daily or as needed. Some minor pink/ blood tinged drainage is expected. This will stop in a few days after surgery.  Shower per your regular routine daily.  Do not take hot showers. Take warm showers that are less than 10 minutes. Path the incision dry. Wear an abdominal binder daily with activity. You do not have to wear this while sleeping or sitting.  Rest and listen to your body, but do not remain in bed all day.  Walk everyday for at least 15-20 minutes. Deep cough and move around every 1-2 hours in the first few days after surgery.  Do not lift > 10 lbs, perform excessive bending, pushing, pulling, squatting for 6-8 weeks after surgery.  The activity restrictions and the abdominal binder are to prevent hernia formation at your incision while you are healing.  Do not place lotions or balms on your incision unless instructed to specifically by Dr. Constance Haw.   Medication: Take tylenol and ibuprofen as needed for pain control, alternating every 4-6 hours.  Example:  Tylenol 1000mg  @ 6am, 12noon, 6pm, 70midnight (Do not exceed 4000mg  of tylenol a day). Ibuprofen 800mg  @ 9am, 3pm, 9pm, 3am (Do not exceed 3600mg  of ibuprofen a day).  Take Roxicodone for breakthrough pain every 4 hours.  Take Colace for constipation related to narcotic pain medication. If you do not have a  bowel movement in 2 days, take Miralax over the counter.  Drink plenty of water to also prevent constipation.   Contact Information: If you have questions or concerns, please call our office, [phone], Monday- Thursday 8AM-5PM and Friday 8AM-12Noon.  If it is after hours or on the weekend, please call [facility], [phone], and ask to speak to the surgeon on call for Dr. Constance Haw at [facility].    Open Small Bowel Resection, Care After This sheet gives you information about how to care for yourself after your procedure. Your health care provider may also give you more specific instructions. If you have problems or questions, contact your health care provider. What can I expect after the procedure? After the procedure, it is common to have:  Pain in your abdomen, especially along your incision. You will be given pain medicines to control this.  Tiredness. This is a normal part of the recovery process. Your energy level will return to normal over the next several weeks.  Constipation. You may be given a stool softener to help prevent this. Follow these instructions at home: Medicines  Take over-the-counter and prescription medicines only as told by your health care provider.  Do not drive or use heavy machinery while taking prescription pain medicine.  If you were prescribed an antibiotic medicine, use it as told by your health care provider. Do not stop using the antibiotic even if you start to feel better. Activity  Return to your  normal activities as told by your health care provider. Ask your health care provider what activities are safe for you.  Do not lift anything that is heavier than 10 lb (4.5 kg) until your health care provider says that it is safe.  Take frequent rest breaks during the day as needed.  Try to take short walks every day for the amount of time that your health care provider suggests.  Avoid activities that require a lot of effort (are  strenuous) for as long as told by your health care provider. Incision care   Follow instructions from your health care provider about how to take care of your incision. Make sure you: ? Wash your hands with soap and water before you change your bandage (dressing). If soap and water are not available, use hand sanitizer. ? Change your dressing as told by your health care provider. ? Leave stitches (sutures), skin glue, or adhesive strips in place. These skin closures may need to stay in place for 2 weeks or longer. If adhesive strip edges start to loosen and curl up, you may trim the loose edges. Do not remove adhesive strips completely unless your health care provider tells you to do that.  Check your incision area every day for signs of infection. Check for: ? More redness, swelling, or pain. ? More fluid or blood. ? Warmth. ? Pus or a bad smell.  Do not take baths, swim, or use a hot tub until your health care provider approves. Ask your health care provider if you can take showers. You may only be allowed to take sponge baths for bathing. General instructions  Continue to practice deep breathing and coughing. If it hurts to cough, try holding a pillow against your abdomen as you cough.  To prevent or treat constipation while you are taking prescription pain medicine, your health care provider may recommend that you: ? Drink enough fluid to keep your urine clear or pale yellow. ? Take over-the-counter or prescription medicines. ? Eat foods that are high in fiber, such as fresh fruits and vegetables, whole grains, and beans. ? Limit foods that are high in fat and processed sugars, such as fried and sweet foods.  Do not use any products that contain nicotine or tobacco as told by your health care provider. These include cigarettes and e-cigarettes. If you need help quitting, ask your health care provider.  Keep all follow-up visits as told by your health care provider. This is  important. Contact a health care provider if:  You have pain that is not relieved with medicine.  You do not feel like eating.  You feel nauseous or you vomit.  You have constipation that is not relieved with prescribed stool softeners.  You have more redness, swelling, or pain around your incision.  You have more fluid or blood coming from your incision.  Your incision feels warm to the touch.  You have pus or a bad smell coming from your incision.  You have a fever. Get help right away if:  Your pain gets worse, even after you take pain medicine.  Your legs or arms hurt or become red or swollen.  You have chest pain.  You have trouble breathing. This information is not intended to replace advice given to you by your health care provider. Make sure you discuss any questions you have with your health care provider. Document Released: 06/18/2010 Document Revised: 12/27/2016 Document Reviewed: 10/16/2015 Elsevier Patient Education  2020 [employer]  Hernia Repair, Adult, Care After This sheet gives you information about how to care for yourself after your procedure. Your health care provider may also give you more specific instructions. If you have problems or questions, contact your health care provider. What can I expect after the procedure? After the procedure, it is common to have:  Mild discomfort.  Slight bruising.  Minor swelling.  Pain in the abdomen. Follow these instructions at home: Incision care   Follow instructions from your health care provider about how to take care of your incision area. Make sure you: ? Wash your hands with soap and water before you change your bandage (dressing). If soap and water are not available, use hand sanitizer. ? Change your dressing as told by your health care provider. ? Leave stitches (sutures), skin glue, or adhesive strips in place. These skin closures may need to stay in place for 2 weeks or longer. If  adhesive strip edges start to loosen and curl up, you may trim the loose edges. Do not remove adhesive strips completely unless your health care provider tells you to do that.  Check your incision area every day for signs of infection. Check for: ? More redness, swelling, or pain. ? More fluid or blood. ? Warmth. ? Pus or a bad smell. Activity  Do not drive or use heavy machinery while taking prescription pain medicine. Do not drive until your health care provider approves.  Until your health care provider approves: ? Do not lift anything that is heavier than 10 lb (4.5 kg). ? Do not play contact sports.  Return to your normal activities as told by your health care provider. Ask your health care provider what activities are safe. General instructions  To prevent or treat constipation while you are taking prescription pain medicine, your health care provider may recommend that you: ? Drink enough fluid to keep your urine clear or pale yellow. ? Take over-the-counter or prescription medicines. ? Eat foods that are high in fiber, such as fresh fruits and vegetables, whole grains, and beans. ? Limit foods that are high in fat and processed sugars, such as fried and sweet foods.  Take over-the-counter and prescription medicines only as told by your health care provider.  Do not take tub baths or go swimming until your health care provider approves.  Keep all follow-up visits as told by your health care provider. This is important. Contact a health care provider if:  You develop a rash.  You have more redness, swelling, or pain around your incision.  You have more fluid or blood coming from your incision.  Your incision feels warm to the touch.  You have pus or a bad smell coming from your incision.  You have a fever or chills.  You have blood in your stool (feces).  You have not had a bowel movement in 2-3 days.  Your pain is not controlled with medicine. Get help right  away if:  You have chest pain or shortness of breath.  You feel light-headed or feel faint.  You have severe pain.  You vomit and your pain is worse. This information is not intended to replace advice given to you by your health care provider. Make sure you discuss any questions you have with your health care provider. Document Released: 08/03/2004 Document Revised: 12/27/2016 Document Reviewed: 06/28/2015 Elsevier Patient Education  2020 [employer]

## 2018-09-12 NOTE — Op Note (Signed)
[facility] Operative Note  09/12/18  Preoperative Diagnosis:  Incarcerated left inguinal hernia with small bowel obstruction   Postoperative Diagnosis: Same   Procedure(s) Performed: Primary Left inguinal hernia repair ([facility] Repair); Exploratory laparotomy small bowel resection, primary side to side anastomosis    Surgeon: Lanell Matar. Constance Haw, MD   Assistants: No qualified resident was available    Anesthesia: General endotracheal   Anesthesiologist: Lenice Llamas, MD    Specimens:  Small bowel    Estimated Blood Loss: Minimal   Blood Replacement: None    Complications: None   Wound Class: Clean contaminated    Operative Indications:  [name] is a 63 yo with a incarcerated left inguinal hernia that is causing a small bowel obstruction. The patient came to the Ed with 1 day of pain and nausea,vomiting. CT scan demonstrated the hernia and incarceration and obstruction. The ED attempted reduction, but this was not successful.  We discussed the risk of surgery including bleeding, infection, need for bowel resection, possible laparotomy in addition to groin incision, recurrence of hernia, possible mesh use versus not using mesh, and she opted to proceed.   Findings: Left inguinal hernia, incarcerated bowel; Ischemic small bowel with narrowing, concerned it would stricture    Procedure: The patient was taken to the operating room and placed supine. General endotracheal anesthesia was induced. Intravenous antibiotics were administered per protocol.  A nasogastric tube was already present to decompress the stomach. A foley catheter was placed The abdomen and left groin were prepared and draped in the usual sterile fashion.   An incision was made in a natural skin crease between the pubic tubercle and the anterior superior iliac spine. This was overlying the hardened bulge.  The incision was deepened with electrocautery through Scarpa's and Camper's fascia until the  aponeurosis of the external oblique was encountered.  The large bulge was noted in the inguinal canal.  An incision was made in the midportion of the external oblique aponeurosis in the direction of its fibers. The ilioinguinal nerve was protected throughout the dissection.  Flaps of the external oblique were developed cephalad and inferiorly.  The hernia sac was bulging and appeared to have compromised hardened bowel. I did not want to open the sac and reduce it for fear of contaminating this wound.  The round ligament was ligated with the ligasure.  Given the concern for the ischemic possible necrotic bowel, I made small midline celiotomy, and carried this down through the subcutaneous tissue with electrocautery and entered the abdomen with care. I felt down to the left inguinal region and carefully reduced the bowel into the peritoneal cavity. The small bowel that had been incarcerated had ischemic changes and some hemorrhage. There was a already a narrowing developing in the bowel.  Due to this, I opted to perform a resection. Towels were laid out. The proximal and distal points of transection were determined and taken with a linear cutting stapler, and the mesentery was divided with a ligasure.  The bowel was anastomosed at the antimesenteric edges. Enterotomies were created an a linear cutting stapler was fired. The common enterotomy was closed with a TA stapler.  The staple line was oversewn with 3-0 lembert Silk suture. A crotch stitch was placed. Vicryl 3-0 was used to close the small mesenteric defect. The specimen was sent to pathology.  The midline fascia was closed with 0 PDS suture in the standard fashion.    Attention was then turned back to the hernia repair,  given the ischemia and resection, a primary repair was performed.  The hernia sac was ligated with a 3-0 vicryl suture.  The redundant sac was excised.  The stump was made hemostatic and placed into the abdomen.   The floor of the canal  was very lax and the conjoint tendon was very attenuated. A relaxing incision was made in order to get more of the strong conjoint tendon pulled down.  The conjoint tendon reached down to Cooper's ligament with some minor tension.  The conjoint tendon was sutured to the shelving edge with 0 Ethibond suture.  This was done in the interrupted fashion.  The suture line began at the pubic tubercle and commenced laterally to the femoral vessels with a transition stitch incorporating Cooper's ligament and the anterior femoral sheath. Care was taken to not take deep bites and injure the vessels. The defect was closed. The external oblique aponeurosis was closed with 2-0 Vicryl suture. The internal region was obliterated. Scarpa's fascia was closed with 2-0 Vicryl suture. The skin was closed with staples on the left groin and celiotomy.  Sterile honeycomb dressings were applied.   All counts were correct at the end of the case. The patient was awakened from anesthesia and extubated without complication.  The patient went to the PACU in stable condition.   Algis GreenhouseLindsay Bridges, MD [facility] [address] [place], [place] [phone] (office)

## 2018-09-12 NOTE — Progress Notes (Signed)
[facility]  Patient looking ok this AM. Has ileus. Was having nausea with the NG, made sure it was working. Told her to ambulate. Practiced IS and up to 1000.  Encouraged movement.   Await bowel function. Foley out K replaced and Mg replaced   S/p Left primary inguinal hernia repair and Ex lap, SBR for ischemic bowel.   Expect will be here a few days with ileus and then progress.   Has staples so these will need to be removed.  Patient aware of risk of recurrence of hernia and restrictions.   Curlene Labrum, MD [facility] [address] [place],  [phone] (office)

## 2018-09-12 NOTE — Transfer of Care (Signed)
Immediate Anesthesia Transfer of Care Note  Patient: Claire Knight  Procedure(s) Performed: LEFT INCARCERATED INGUINAL HERNIA REPAIR  (Left ) EXPLORATORY LAPAROTOMY; SMALL BOWEL RESECTION (Abdomen)  Patient Location: PACU  Anesthesia Type:General  Level of Consciousness: awake and alert   Airway & Oxygen Therapy: Patient Spontanous Breathing  Post-op Assessment: Report given to RN  Post vital signs: Reviewed and stable  Last Vitals:  Vitals Value Taken Time  BP    Temp    Pulse 87 09/12/18 0222  Resp 22 09/12/18 0222  SpO2 95 % 09/12/18 0222  Vitals shown include unvalidated device data.  Last Pain:  Vitals:   09/11/18 1709  TempSrc:   PainSc: 10-Worst pain ever         Complications: No apparent anesthesia complications

## 2018-09-12 NOTE — Progress Notes (Signed)
Incentive has been placed in room. Patient still asleep.

## 2018-09-12 NOTE — Progress Notes (Signed)
Patients foley was removed on day shift approximately around 2 o'clock. Patient hasnt voided bladder scan showed 83. Paged Mid level to make aware. Will rescan bladder.

## 2018-09-12 NOTE — Anesthesia Procedure Notes (Signed)
Procedure Name: Intubation Date/Time: 09/12/2018 12:19 AM Performed by: Lenice Llamas, MD Pre-anesthesia Checklist: Patient identified, Patient being monitored, Timeout performed, Emergency Drugs available and Suction available Patient Re-evaluated:Patient Re-evaluated prior to induction Oxygen Delivery Method: Circle System Utilized Preoxygenation: Pre-oxygenation with 100% oxygen Induction Type: IV induction, Rapid sequence and Cricoid Pressure applied Laryngoscope Size: Mac and 4 Grade View: Grade I Tube type: Oral Tube size: 7.0 mm Number of attempts: 1 Airway Equipment and Method: Stylet Placement Confirmation: ETT inserted through vocal cords under direct vision,  positive ETCO2 and breath sounds checked- equal and bilateral Secured at: 21 cm Tube secured with: Tape Dental Injury: Teeth and Oropharynx as per pre-operative assessment

## 2018-09-12 NOTE — Anesthesia Postprocedure Evaluation (Signed)
Anesthesia Post Note  Patient: Claire Knight  Procedure(s) Performed: LEFT INCARCERATED INGUINAL HERNIA REPAIR  (Left ) EXPLORATORY LAPAROTOMY; SMALL BOWEL RESECTION (Abdomen)  Patient location during evaluation: PACU Anesthesia Type: General Level of consciousness: awake, awake and alert and sedated Pain management: pain level controlled Vital Signs Assessment: post-procedure vital signs reviewed and stable Respiratory status: spontaneous breathing, nonlabored ventilation and patient connected to nasal cannula oxygen Cardiovascular status: blood pressure returned to baseline and stable Postop Assessment: no headache Anesthetic complications: no     Last Vitals:  Vitals:   09/11/18 2000 09/11/18 2030  BP: (!) 151/85 139/83  Pulse: 82 83  Resp: 19 11  Temp:    SpO2: (!) 89% 96%    Last Pain:  Vitals:   09/11/18 1709  TempSrc:   PainSc: 10-Worst pain ever                 Lenice Llamas

## 2018-09-13 ENCOUNTER — Inpatient Hospital Stay (HOSPITAL_COMMUNITY): Payer: No Typology Code available for payment source

## 2018-09-13 LAB — BASIC METABOLIC PANEL
Anion gap: 8 (ref 5–15)
BUN: 17 mg/dL (ref 8–23)
CO2: 25 mmol/L (ref 22–32)
Calcium: 7.8 mg/dL — ABNORMAL LOW (ref 8.9–10.3)
Chloride: 105 mmol/L (ref 98–111)
Creatinine, Ser: 0.37 mg/dL — ABNORMAL LOW (ref 0.44–1.00)
GFR calc Af Amer: >60 mL/min (ref >60)
GFR calc non Af Amer: >60 mL/min (ref >60)
Glucose, Bld: 120 mg/dL — ABNORMAL HIGH (ref 70–99)
Potassium: 3.4 mmol/L — ABNORMAL LOW (ref 3.5–5.1)
Sodium: 138 mmol/L (ref 135–145)

## 2018-09-13 LAB — CBC WITH DIFFERENTIAL/PLATELET
Abs Immature Granulocytes: 0.07 10*3/uL (ref 0.00–0.07)
Basophils Absolute: 0 10*3/uL (ref 0.0–0.1)
Basophils Relative: 0 %
Eosinophils Absolute: 0 10*3/uL (ref 0.0–0.5)
Eosinophils Relative: 0 %
HCT: 38.4 % (ref 36.0–46.0)
Hemoglobin: 12.3 g/dL (ref 12.0–15.0)
Immature Granulocytes: 1 %
Lymphocytes Relative: 17 %
Lymphs Abs: 1.3 10*3/uL (ref 0.7–4.0)
MCH: 31.7 pg (ref 26.0–34.0)
MCHC: 32 g/dL (ref 30.0–36.0)
MCV: 99 fL (ref 80.0–100.0)
Monocytes Absolute: 0.6 10*3/uL (ref 0.1–1.0)
Monocytes Relative: 7 %
Neutro Abs: 5.9 10*3/uL (ref 1.7–7.7)
Neutrophils Relative %: 75 %
Platelets: 236 10*3/uL (ref 150–400)
RBC: 3.88 MIL/uL (ref 3.87–5.11)
RDW: 14.3 % (ref 11.5–15.5)
WBC: 7.9 10*3/uL (ref 4.0–10.5)
nRBC: 0 % (ref 0.0–0.2)

## 2018-09-13 LAB — MAGNESIUM: Magnesium: 2.1 mg/dL (ref 1.7–2.4)

## 2018-09-13 LAB — GLUCOSE, CAPILLARY: Glucose-Capillary: 108 mg/dL — ABNORMAL HIGH (ref 70–99)

## 2018-09-13 LAB — URINE CULTURE: Culture: <10000 — AB

## 2018-09-13 LAB — PHOSPHORUS: Phosphorus: 1.7 mg/dL — ABNORMAL LOW (ref 2.5–4.6)

## 2018-09-13 MED ORDER — KCL IN DEXTROSE-NACL 40-5-0.45 MEQ/L-%-% IV SOLN
INTRAVENOUS | Status: DC
  Administered 2018-09-13 – 2018-09-15 (×3): via INTRAVENOUS

## 2018-09-13 MED ORDER — POTASSIUM PHOSPHATES 15 MMOLE/5ML IV SOLN
15.0000 mmol | Freq: Once | INTRAVENOUS | Status: AC
  Administered 2018-09-13: 15 mmol via INTRAVENOUS
  Filled 2018-09-13: qty 5

## 2018-09-13 MED ORDER — SODIUM CHLORIDE 0.9 % IV BOLUS
500.0000 mL | Freq: Once | INTRAVENOUS | Status: AC
  Administered 2018-09-13: 500 mL via INTRAVENOUS

## 2018-09-13 MED ORDER — NON FORMULARY: 1.0000 | Freq: Two times a day (BID) | Status: DC

## 2018-09-13 MED ORDER — MENTHOL 3 MG MT LOZG
1.0000 | LOZENGE | OROMUCOSAL | Status: DC | PRN
  Administered 2018-09-13: 3 mg via ORAL
  Filled 2018-09-13: qty 9

## 2018-09-13 NOTE — Progress Notes (Signed)
Patient hasn't voided since in and out catheter. Bladder scan showed 170cc of urine. Transferred patient to bedside commode. Patient voided 200cc of urine. Patient tolerated well with transferring from bed to bedside commode. Let on coming shift know about output. Will continue to monitor throughout shift.

## 2018-09-13 NOTE — Progress Notes (Signed)
Bladder scanned patient. Patient still hasn't voided since foley has been removed. Pt states she doesn't feel like she has to urinate. Bladder scan showed 254mls of urine. Paged Mid level no new orders at this time will continue to monitor throughout shift.

## 2018-09-13 NOTE — Progress Notes (Signed)
2 Days Post-Op  Subjective: Patient complains of NG tube discomfort.  No bowel movement or flatus yet.  Objective: Vital signs in last 24 hours: Temp:  [98.1 F (36.7 C)-98.4 F (36.9 C)] 98.3 F (36.8 C) (08/16 0355) Pulse Rate:  [91-101] 92 (08/16 0355) Resp:  [17-18] 18 (08/16 0355) BP: (116-141)/(63-82) 119/65 (08/16 0355) SpO2:  [89 %-94 %] 89 % (08/16 0355) Last BM Date: 09/10/18  Intake/Output from previous day: 08/15 0701 - 08/16 0700 In: 2987.3 [I.V.:2309.3; IV Piggyback:677.9] Out: 1300 [Urine:1300] Intake/Output this shift: No intake/output data recorded.  General appearance: alert, cooperative and no distress Resp: clear to auscultation bilaterally Cardio: regular rate and rhythm, S1, S2 normal, no murmur, click, rub or gallop GI: Soft with minimal bowel sounds appreciated.  Incision is healing well.to palpation.  No rigidity is noted.  Lab Results:  Recent Labs    09/12/18 0625 09/13/18 0629  WBC 12.5* 7.9  HGB 14.4 12.3  HCT 44.3 38.4  PLT 275 236   BMET Recent Labs    09/12/18 0625 09/13/18 0629  NA 140 138  K 3.1* 3.4*  CL 105 105  CO2 23 25  GLUCOSE 161* 120*  BUN 21 17  CREATININE 0.54 0.37*  CALCIUM 8.6* 7.8*   PT/INR No results for input(s): LABPROT, INR in the last 72 hours.  Studies/Results: Dg Chest 1 View  Result Date: 09/13/2018 CLINICAL DATA:  Nasogastric tube placement EXAM: CHEST  1 VIEW COMPARISON:  09/11/2018 FINDINGS: The nasogastric tube tip and side port project over the stomach. Shallow lung inflation without focal airspace consolidation. IMPRESSION: Nasogastric tube tip and side port project over the stomach. Electronically Signed   By: Ulyses Jarred M.D.   On: 09/13/2018 06:12   Ct Abdomen Pelvis W Contrast  Result Date: 09/11/2018 CLINICAL DATA:  Abdominal pain EXAM: CT ABDOMEN AND PELVIS WITH CONTRAST TECHNIQUE: Multidetector CT imaging of the abdomen and pelvis was performed using the standard protocol following  bolus administration of intravenous contrast. CONTRAST:  184mL OMNIPAQUE IOHEXOL 300 MG/ML  SOLN COMPARISON:  None. FINDINGS: Lower chest: Lung bases demonstrate no acute consolidation or effusion. Heart size within normal limits. Bilateral breast implants, diffusely calcified on the left. Large para esophageal hiatal hernia. Hepatobiliary: No focal liver abnormality is seen. No gallstones, gallbladder wall thickening, or biliary dilatation. Pancreas: Unremarkable. No pancreatic ductal dilatation or surrounding inflammatory changes. Spleen: Normal in size without focal abnormality. Adrenals/Urinary Tract: Adrenal glands are unremarkable. Kidneys are normal, without renal calculi, focal lesion, or hydronephrosis. Bladder is unremarkable. Stomach/Bowel: Dilated fluid-filled loops of mid small bowel, measuring up to 3 cm. Transition point related to a left inguinal hernia containing mesentery and segment of small bowel. There is edema and fluid in the hernia sac. The bowel distal to the hernia is decompressed. Sigmoid colon diverticula without acute inflammatory change Vascular/Lymphatic: Nonaneurysmal aorta. Mild aortic atherosclerosis. No significantly enlarged lymph nodes. Reproductive: Status post hysterectomy. No adnexal masses. Other: Negative for free air or free fluid Musculoskeletal: Grade 1 anterolisthesis L4 on L5. Degenerative changes. No acute osseous abnormality IMPRESSION: 1. Findings consistent with mechanical small bowel obstruction, secondary to an incarcerated left inguinal hernia. 2. Large hiatal hernia 3. Sigmoid colon diverticular disease without acute inflammatory change Electronically Signed   By: Donavan Foil M.D.   On: 09/11/2018 21:33   Dg Chest Port 1 View  Result Date: 09/11/2018 CLINICAL DATA:  Abdomen pain with nausea and vomiting EXAM: PORTABLE CHEST 1 VIEW COMPARISON:  None. FINDINGS: The heart size  and mediastinal contours are within normal limits. Both lungs are clear. The  visualized skeletal structures are unremarkable. IMPRESSION: No active disease. Electronically Signed   By: Jasmine PangKim  Fujinaga M.D.   On: 09/11/2018 20:17   Dg Abd Portable 1 View  Result Date: 09/11/2018 CLINICAL DATA:  NG tube placement EXAM: PORTABLE ABDOMEN - 1 VIEW COMPARISON:  September 11, 2018 FINDINGS: Tip the NG tube is seen projecting over the mid body of the stomach. Contrast seen within the collecting systems. Air-filled loops of bowel is seen. IMPRESSION: NG tube tip seen projecting over the stomach. Electronically Signed   By: Jonna ClarkBindu  Avutu M.D.   On: 09/11/2018 23:25    Anti-infectives: Anti-infectives (From admission, onward)   Start     Dose/Rate Route Frequency Ordered Stop   09/12/18 0600  cefoTEtan (CEFOTAN) 2 g in sodium chloride 0.9 % 100 mL IVPB     2 g 200 mL/hr over 30 Minutes Intravenous On call to O.R. 09/12/18 0049 09/12/18 0021      Assessment/Plan: s/p Procedure(s): LEFT INCARCERATED INGUINAL HERNIA REPAIR  EXPLORATORY LAPAROTOMY; SMALL BOWEL RESECTION Impression: Stable on postoperative day 2.  Awaiting return of bowel function.  NG tube output still increased, though not recorded.  Hypophosphatemia is noted and will be addressed.  Mild hypokalemia.  Will adjust IV fluids.  Patient has voided since Foley removal.  LOS: 1 day    Franky MachoMark Dyllen Menning 09/13/2018

## 2018-09-13 NOTE — Progress Notes (Signed)
Mid level placed order for in and out catheter.  

## 2018-09-13 NOTE — Progress Notes (Signed)
Paged Dr. Constance Haw about patient not voiding. Dr. Constance Haw gave verbal order to give 500cc bolus of fluid if urine amount is less than 360cc after in and out cath. Will continue to monitor throughout shift.

## 2018-09-13 NOTE — Progress Notes (Signed)
In and Out catheter patient. 300cc of urine returned. Will give 500cc bolus of normal saline fluid per Dr. Constance Haw verbal order. Will continue to monitor throughout shift.

## 2018-09-13 NOTE — Progress Notes (Addendum)
MEDICATION RELATED CONSULT NOTE - INITIAL   Pharmacy Consult for: management of hypophosphatemia Indication:  Hypophosphatemia in adult  Patient Measurements: Height: 5\' 2"  (157.5 cm) Weight: 160 lb (72.6 kg) IBW/kg (Calculated) : 50.1 Adjusted Body Weight: 54.5 kg  Vital Signs: Temp: 98.3 F (36.8 C) (08/16 0355) BP: 119/65 (08/16 0355) Pulse Rate: 92 (08/16 0355) Intake/Output from previous day: 08/15 0701 - 08/16 0700 In: 2987.3 [I.V.:2309.3; IV Piggyback:677.9] Out: 1300 [Urine:1300] Intake/Output from this shift: No intake/output data recorded.  Labs: Recent Labs    09/11/18 1830 09/12/18 0625 09/13/18 0629  WBC 12.2* 12.5* 7.9  HGB 16.0* 14.4 12.3  HCT 48.9* 44.3 38.4  PLT 321 275 236  CREATININE 0.47 0.54 0.37*  MG  --  1.7 2.1  PHOS  --  3.4 1.7*  ALBUMIN 4.3  --   --   PROT 7.5  --   --   AST 25  --   --   ALT 29  --   --   ALKPHOS 126  --   --   BILITOT 0.9  --   --    Estimated Creatinine Clearance: 67.2 mL/min (A) (by C-G formula based on SCr of 0.37 mg/dL (L)).     Assessment:  Pharmacy consulted to manage phosphate replacement for this 63 yo female with hypophosphatemia. She had hernia repair and small bowel resection two days ago.  Patient seems to have fairly stable kidney function for age.  K= 3.4 Na= 138 Phos= 1.7  Goal of Therapy: maintain phosphate levels between 2.6-4.5mg /dL     Plan:  Give potassium phosphate 79mmol IV x1 dose over 6 hours today Pharmacy will continue to monitor labs as appropriate.  Despina Pole 09/13/2018,11:49 AM

## 2018-09-14 ENCOUNTER — Encounter (HOSPITAL_COMMUNITY): Payer: Self-pay | Admitting: General Surgery

## 2018-09-14 LAB — CBC WITH DIFFERENTIAL/PLATELET
Abs Immature Granulocytes: 0.07 10*3/uL (ref 0.00–0.07)
Basophils Absolute: 0 10*3/uL (ref 0.0–0.1)
Basophils Relative: 0 %
Eosinophils Absolute: 0.1 10*3/uL (ref 0.0–0.5)
Eosinophils Relative: 1 %
HCT: 35.1 % — ABNORMAL LOW (ref 36.0–46.0)
Hemoglobin: 11.2 g/dL — ABNORMAL LOW (ref 12.0–15.0)
Immature Granulocytes: 1 %
Lymphocytes Relative: 19 %
Lymphs Abs: 1.6 10*3/uL (ref 0.7–4.0)
MCH: 31.9 pg (ref 26.0–34.0)
MCHC: 31.9 g/dL (ref 30.0–36.0)
MCV: 100 fL (ref 80.0–100.0)
Monocytes Absolute: 0.6 10*3/uL (ref 0.1–1.0)
Monocytes Relative: 7 %
Neutro Abs: 6.4 10*3/uL (ref 1.7–7.7)
Neutrophils Relative %: 72 %
Platelets: 219 10*3/uL (ref 150–400)
RBC: 3.51 MIL/uL — ABNORMAL LOW (ref 3.87–5.11)
RDW: 13.9 % (ref 11.5–15.5)
WBC: 8.9 10*3/uL (ref 4.0–10.5)
nRBC: 0 % (ref 0.0–0.2)

## 2018-09-14 LAB — BASIC METABOLIC PANEL
Anion gap: 8 (ref 5–15)
BUN: 10 mg/dL (ref 8–23)
CO2: 26 mmol/L (ref 22–32)
Calcium: 7.7 mg/dL — ABNORMAL LOW (ref 8.9–10.3)
Chloride: 104 mmol/L (ref 98–111)
Creatinine, Ser: 0.35 mg/dL — ABNORMAL LOW (ref 0.44–1.00)
GFR calc Af Amer: >60 mL/min (ref >60)
GFR calc non Af Amer: >60 mL/min (ref >60)
Glucose, Bld: 132 mg/dL — ABNORMAL HIGH (ref 70–99)
Potassium: 3.9 mmol/L (ref 3.5–5.1)
Sodium: 138 mmol/L (ref 135–145)

## 2018-09-14 LAB — MAGNESIUM: Magnesium: 2 mg/dL (ref 1.7–2.4)

## 2018-09-14 LAB — PHOSPHORUS: Phosphorus: 1.3 mg/dL — ABNORMAL LOW (ref 2.5–4.6)

## 2018-09-14 MED ORDER — PANTOPRAZOLE SODIUM 40 MG PO TBEC
40.0000 mg | DELAYED_RELEASE_TABLET | Freq: Every day | ORAL | Status: DC
  Administered 2018-09-14 – 2018-09-17 (×4): 40 mg via ORAL
  Filled 2018-09-14 (×4): qty 1

## 2018-09-14 MED ORDER — HYDROCODONE-ACETAMINOPHEN 7.5-325 MG PO TABS
1.0000 | ORAL_TABLET | Freq: Four times a day (QID) | ORAL | Status: DC | PRN
  Administered 2018-09-15 – 2018-09-16 (×3): 1 via ORAL
  Filled 2018-09-14 (×3): qty 1

## 2018-09-14 MED ORDER — POTASSIUM PHOSPHATES 15 MMOLE/5ML IV SOLN
30.0000 mmol | Freq: Once | INTRAVENOUS | Status: AC
  Administered 2018-09-14: 30 mmol via INTRAVENOUS
  Filled 2018-09-14: qty 10

## 2018-09-14 MED ORDER — TRAMADOL HCL 50 MG PO TABS
50.0000 mg | ORAL_TABLET | Freq: Four times a day (QID) | ORAL | Status: DC | PRN
  Administered 2018-09-15 – 2018-09-17 (×5): 50 mg via ORAL
  Filled 2018-09-14 (×5): qty 1

## 2018-09-14 NOTE — Progress Notes (Signed)
3 Days Post-Op  Subjective: Patient starting to pass flatus.  No significant incisional pain noted.  Objective: Vital signs in last 24 hours: Temp:  [98 F (36.7 C)] 98 F (36.7 C) (08/16 1835) Pulse Rate:  [89-94] 89 (08/17 0507) Resp:  [16-19] 16 (08/17 0507) BP: (124-136)/(68-74) 136/74 (08/17 0507) SpO2:  [91 %-93 %] 91 % (08/17 0507) Last BM Date: 09/10/18  Intake/Output from previous day: 08/16 0701 - 08/17 0700 In: 1326.4 [I.V.:1238.1; IV Piggyback:88.4] Out: 1275 [Urine:1150; Emesis/NG output:125] Intake/Output this shift: Total I/O In: 0  Out: 400 [Urine:400]  General appearance: alert, cooperative and no distress Resp: clear to auscultation bilaterally Cardio: regular rate and rhythm, S1, S2 normal, no murmur, click, rub or gallop GI: Incisions healing well, abdomen soft.  Occasional bowel sounds appreciated.  No distention noted.  Lab Results:  Recent Labs    09/13/18 0629 09/14/18 0622  WBC 7.9 8.9  HGB 12.3 11.2*  HCT 38.4 35.1*  PLT 236 219   BMET Recent Labs    09/13/18 0629 09/14/18 0622  NA 138 138  K 3.4* 3.9  CL 105 104  CO2 25 26  GLUCOSE 120* 132*  BUN 17 10  CREATININE 0.37* 0.35*  CALCIUM 7.8* 7.7*   PT/INR No results for input(s): LABPROT, INR in the last 72 hours.  Studies/Results: Dg Chest 1 View  Result Date: 09/13/2018 CLINICAL DATA:  Nasogastric tube placement EXAM: CHEST  1 VIEW COMPARISON:  09/11/2018 FINDINGS: The nasogastric tube tip and side port project over the stomach. Shallow lung inflation without focal airspace consolidation. IMPRESSION: Nasogastric tube tip and side port project over the stomach. Electronically Signed   By: Ulyses Jarred M.D.   On: 09/13/2018 06:12    Anti-infectives: Anti-infectives (From admission, onward)   Start     Dose/Rate Route Frequency Ordered Stop   09/12/18 0600  cefoTEtan (CEFOTAN) 2 g in sodium chloride 0.9 % 100 mL IVPB     2 g 200 mL/hr over 30 Minutes Intravenous On call to  O.R. 09/12/18 0049 09/12/18 0021      Assessment/Plan: s/p Procedure(s): LEFT INCARCERATED INGUINAL HERNIA REPAIR  EXPLORATORY LAPAROTOMY; SMALL BOWEL RESECTION Impression: Patient's bowel function is starting to return.  We will remove NG tube and start clear liquid diet.  Will get patient up to chair.  Hypophosphatemia being addressed by pharmacy.  LOS: 2 days    Aviva Signs 09/14/2018

## 2018-09-14 NOTE — Progress Notes (Signed)
MEDICATION RELATED CONSULT NOTE -   Pharmacy Consult for: management of hypophosphatemia Indication:  Hypophosphatemia in adult  Patient Measurements: Height: 5\' 2"  (157.5 cm) Weight: 160 lb (72.6 kg) IBW/kg (Calculated) : 50.1 Adjusted Body Weight: 54.5 kg  Vital Signs: BP: 136/74 (08/17 0507) Pulse Rate: 89 (08/17 0507) Intake/Output from previous day: 08/16 0701 - 08/17 0700 In: 1326.4 [I.V.:1238.1; IV Piggyback:88.4] Out: 1275 [Urine:1150; Emesis/NG output:125] Intake/Output from this shift: Total I/O In: 0  Out: 400 [Urine:400]  Labs: Recent Labs    09/11/18 1830 09/12/18 0625 09/13/18 0629 09/14/18 0622  WBC 12.2* 12.5* 7.9 8.9  HGB 16.0* 14.4 12.3 11.2*  HCT 48.9* 44.3 38.4 35.1*  PLT 321 275 236 219  CREATININE 0.47 0.54 0.37* 0.35*  MG  --  1.7 2.1 2.0  PHOS  --  3.4 1.7* 1.3*  ALBUMIN 4.3  --   --   --   PROT 7.5  --   --   --   AST 25  --   --   --   ALT 29  --   --   --   ALKPHOS 126  --   --   --   BILITOT 0.9  --   --   --    Estimated Creatinine Clearance: 67.2 mL/min (A) (by C-G formula based on SCr of 0.35 mg/dL (L)).     Assessment:  Pharmacy consulted to manage phosphate replacement for this 63 yo female with hypophosphatemia. She had hernia repair and small bowel resection two days ago.  Patient seems to have fairly stable kidney function for age.  K= 3.9 Na= 138 Phos= 1.3  Goal of Therapy: maintain phosphate levels between 2.6-4.5mg /dL     Plan:  Give potassium phosphate 30 mmol IV x1 dose over 6 hours today Pharmacy will continue to monitor labs as appropriate.  Revonda Standard Alishba Naples 09/14/2018,10:40 AM

## 2018-09-15 LAB — MAGNESIUM: Magnesium: 1.9 mg/dL (ref 1.7–2.4)

## 2018-09-15 LAB — CBC WITH DIFFERENTIAL/PLATELET
Abs Immature Granulocytes: 0.14 10*3/uL — ABNORMAL HIGH (ref 0.00–0.07)
Basophils Absolute: 0 10*3/uL (ref 0.0–0.1)
Basophils Relative: 0 %
Eosinophils Absolute: 0.1 10*3/uL (ref 0.0–0.5)
Eosinophils Relative: 1 %
HCT: 35.9 % — ABNORMAL LOW (ref 36.0–46.0)
Hemoglobin: 11.4 g/dL — ABNORMAL LOW (ref 12.0–15.0)
Immature Granulocytes: 2 %
Lymphocytes Relative: 20 %
Lymphs Abs: 1.6 10*3/uL (ref 0.7–4.0)
MCH: 31.8 pg (ref 26.0–34.0)
MCHC: 31.8 g/dL (ref 30.0–36.0)
MCV: 100.3 fL — ABNORMAL HIGH (ref 80.0–100.0)
Monocytes Absolute: 0.6 10*3/uL (ref 0.1–1.0)
Monocytes Relative: 8 %
Neutro Abs: 5.3 10*3/uL (ref 1.7–7.7)
Neutrophils Relative %: 69 %
Platelets: 231 10*3/uL (ref 150–400)
RBC: 3.58 MIL/uL — ABNORMAL LOW (ref 3.87–5.11)
RDW: 13.5 % (ref 11.5–15.5)
WBC: 7.8 10*3/uL (ref 4.0–10.5)
nRBC: 0 % (ref 0.0–0.2)

## 2018-09-15 LAB — BASIC METABOLIC PANEL
Anion gap: 6 (ref 5–15)
BUN: 6 mg/dL — ABNORMAL LOW (ref 8–23)
CO2: 27 mmol/L (ref 22–32)
Calcium: 8.1 mg/dL — ABNORMAL LOW (ref 8.9–10.3)
Chloride: 104 mmol/L (ref 98–111)
Creatinine, Ser: 0.4 mg/dL — ABNORMAL LOW (ref 0.44–1.00)
GFR calc Af Amer: >60 mL/min (ref >60)
GFR calc non Af Amer: >60 mL/min (ref >60)
Glucose, Bld: 122 mg/dL — ABNORMAL HIGH (ref 70–99)
Potassium: 4.1 mmol/L (ref 3.5–5.1)
Sodium: 137 mmol/L (ref 135–145)

## 2018-09-15 LAB — PHOSPHORUS: Phosphorus: 2.5 mg/dL (ref 2.5–4.6)

## 2018-09-15 NOTE — Progress Notes (Signed)
4 Days Post-Op  Subjective: Patient having crampy lower abdominal pain which is relieved with passing flatus.  She has not had a bowel movement yet.  No nausea or vomiting is noted.  Objective: Vital signs in last 24 hours: Temp:  [98.3 F (36.8 C)-98.9 F (37.2 C)] 98.9 F (37.2 C) (08/18 0518) Pulse Rate:  [77-84] 77 (08/18 0518) Resp:  [17-20] 20 (08/18 0518) BP: (121-145)/(67-82) 121/67 (08/18 0518) SpO2:  [92 %-93 %] 93 % (08/18 0518) Last BM Date: 09/10/18  Intake/Output from previous day: 08/17 0701 - 08/18 0700 In: 1418.3 [P.O.:680; I.V.:696.7; IV Piggyback:41.6] Out: 1570 [Urine:1570] Intake/Output this shift: No intake/output data recorded.  General appearance: alert, cooperative and no distress Resp: clear to auscultation bilaterally Cardio: regular rate and rhythm, S1, S2 normal, no murmur, click, rub or gallop GI: Soft, active bowel sounds appreciated.  Incisions healing well.  Lab Results:  Recent Labs    09/14/18 0622 09/15/18 0609  WBC 8.9 7.8  HGB 11.2* 11.4*  HCT 35.1* 35.9*  PLT 219 231   BMET Recent Labs    09/14/18 0622 09/15/18 0609  NA 138 137  K 3.9 4.1  CL 104 104  CO2 26 27  GLUCOSE 132* 122*  BUN 10 6*  CREATININE 0.35* 0.40*  CALCIUM 7.7* 8.1*   PT/INR No results for input(s): LABPROT, INR in the last 72 hours.  Studies/Results: No results found.  Anti-infectives: Anti-infectives (From admission, onward)   Start     Dose/Rate Route Frequency Ordered Stop   09/12/18 0600  cefoTEtan (CEFOTAN) 2 g in sodium chloride 0.9 % 100 mL IVPB     2 g 200 mL/hr over 30 Minutes Intravenous On call to O.R. 09/12/18 0049 09/12/18 0021      Assessment/Plan: s/p Procedure(s): LEFT INCARCERATED INGUINAL HERNIA REPAIR  EXPLORATORY LAPAROTOMY; SMALL BOWEL RESECTION Impression: Continues to progress well.  Bowel function is starting to return.  Will advance to full liquid diet.  Hypophosphatemia resolved.  Will start ambulating patient.  LOS: 3 days    Aviva Signs 09/15/2018

## 2018-09-16 LAB — BASIC METABOLIC PANEL
Anion gap: 7 (ref 5–15)
BUN: 6 mg/dL — ABNORMAL LOW (ref 8–23)
CO2: 29 mmol/L (ref 22–32)
Calcium: 8.9 mg/dL (ref 8.9–10.3)
Chloride: 103 mmol/L (ref 98–111)
Creatinine, Ser: 0.44 mg/dL (ref 0.44–1.00)
GFR calc Af Amer: >60 mL/min (ref >60)
GFR calc non Af Amer: >60 mL/min (ref >60)
Glucose, Bld: 109 mg/dL — ABNORMAL HIGH (ref 70–99)
Potassium: 4.5 mmol/L (ref 3.5–5.1)
Sodium: 139 mmol/L (ref 135–145)

## 2018-09-16 LAB — PHOSPHORUS: Phosphorus: 3.6 mg/dL (ref 2.5–4.6)

## 2018-09-16 LAB — MAGNESIUM: Magnesium: 1.8 mg/dL (ref 1.7–2.4)

## 2018-09-16 NOTE — Progress Notes (Signed)
5 Days Post-Op  Subjective: Patient passing flatus.  No bowel movement yet.  Tolerating full liquid diet well.  No complaints of incisional pain.  Objective: Vital signs in last 24 hours: Temp:  [97.8 F (36.6 C)-98.4 F (36.9 C)] 98.2 F (36.8 C) (08/19 0549) Pulse Rate:  [71-83] 71 (08/19 0549) Resp:  [16-20] 20 (08/19 0549) BP: (124-130)/(67-91) 124/67 (08/19 0549) SpO2:  [96 %-98 %] 96 % (08/19 0549) Last BM Date: 09/10/18  Intake/Output from previous day: 08/18 0701 - 08/19 0700 In: 698 [P.O.:480; I.V.:218] Out: 2400 [Urine:2400] Intake/Output this shift: Total I/O In: 240 [P.O.:240] Out: 450 [Urine:450]  General appearance: alert, cooperative and no distress Resp: clear to auscultation bilaterally Cardio: regular rate and rhythm, S1, S2 normal, no murmur, click, rub or gallop GI: Soft, incisions healing well.  Bowel sounds present.  Nondistended.  Lab Results:  Recent Labs    09/14/18 0622 09/15/18 0609  WBC 8.9 7.8  HGB 11.2* 11.4*  HCT 35.1* 35.9*  PLT 219 231   BMET Recent Labs    09/15/18 0609 09/16/18 0621  NA 137 139  K 4.1 4.5  CL 104 103  CO2 27 29  GLUCOSE 122* 109*  BUN 6* 6*  CREATININE 0.40* 0.44  CALCIUM 8.1* 8.9   PT/INR No results for input(s): LABPROT, INR in the last 72 hours.  Studies/Results: No results found.  Anti-infectives: Anti-infectives (From admission, onward)   Start     Dose/Rate Route Frequency Ordered Stop   09/12/18 0600  cefoTEtan (CEFOTAN) 2 g in sodium chloride 0.9 % 100 mL IVPB     2 g 200 mL/hr over 30 Minutes Intravenous On call to O.R. 09/12/18 0049 09/12/18 0021      Assessment/Plan: s/p Procedure(s): LEFT INCARCERATED INGUINAL HERNIA REPAIR  EXPLORATORY LAPAROTOMY; SMALL BOWEL RESECTION Impression: Continues to do well.  Awaiting full return of bowel function.  Will advance to heart healthy diet.  Hypophosphatemia resolved.  LOS: 4 days    Aviva Signs 09/16/2018

## 2018-09-16 NOTE — Progress Notes (Signed)
MEDICATION RELATED CONSULT NOTE -   Pharmacy Consult for: management of hypophosphatemia Indication:  Hypophosphatemia in adult  Patient Measurements: Height: 5\' 2"  (157.5 cm) Weight: 160 lb (72.6 kg) IBW/kg (Calculated) : 50.1 Adjusted Body Weight: 54.5 kg  Vital Signs: Temp: 98.2 F (36.8 C) (08/19 0549) Temp Source: Oral (08/19 0549) BP: 124/67 (08/19 0549) Pulse Rate: 71 (08/19 0549) Intake/Output from previous day: 08/18 0701 - 08/19 0700 In: 698 [P.O.:480; I.V.:218] Out: 2400 [Urine:2400] Intake/Output from this shift: Total I/O In: 240 [P.O.:240] Out: 450 [Urine:450]  Labs: Recent Labs    09/14/18 0622 09/15/18 0609 09/16/18 0621  WBC 8.9 7.8  --   HGB 11.2* 11.4*  --   HCT 35.1* 35.9*  --   PLT 219 231  --   CREATININE 0.35* 0.40* 0.44  MG 2.0 1.9 1.8  PHOS 1.3* 2.5 3.6   Estimated Creatinine Clearance: 67.2 mL/min (by C-G formula based on SCr of 0.44 mg/dL).     Assessment:  Pharmacy consulted to manage phosphate replacement for this 63 yo female with hypophosphatemia. She had hernia repair and small bowel resection two days ago.  Patient seems to have fairly stable kidney function for age.  K= 3.9>.4.5 Na= 138> 139 stable Phos= 1.3>>3.6, replaced  Goal of Therapy: maintain phosphate levels between 2.6-4.5mg /dL  Plan:  Will sign off  Please reconsult if needed  Isac Sarna, BS Pharm D, BCPS Clinical Pharmacist Pager [phone]  09/16/2018,10:21 AM

## 2018-09-16 NOTE — Plan of Care (Signed)

## 2018-09-17 MED ORDER — TRAMADOL HCL 50 MG PO TABS: 50.0000 mg | ORAL_TABLET | Freq: Four times a day (QID) | ORAL | 0 refills | Status: DC | PRN

## 2018-09-17 MED ORDER — BISACODYL 10 MG RE SUPP
10.0000 mg | Freq: Two times a day (BID) | RECTAL | Status: DC
  Administered 2018-09-17: 10 mg via RECTAL
  Filled 2018-09-17: qty 1

## 2018-09-17 NOTE — Progress Notes (Signed)
6 Days Post-Op  Subjective: Patient has not had a bowel movement yet.  She is still passing flatus.  Objective: Vital signs in last 24 hours: Temp:  [98.2 F (36.8 C)-98.5 F (36.9 C)] 98.2 F (36.8 C) (08/20 0600) Pulse Rate:  [72-76] 72 (08/20 0600) Resp:  [16-18] 18 (08/20 0600) BP: (115-149)/(74-90) 122/74 (08/20 0600) SpO2:  [96 %-98 %] 98 % (08/20 0600) Last BM Date: 09/13/18  Intake/Output from previous day: 08/19 0701 - 08/20 0700 In: 720 [P.O.:720] Out: 1350 [Urine:1350] Intake/Output this shift: No intake/output data recorded.  General appearance: alert, cooperative and no distress Resp: clear to auscultation bilaterally Cardio: regular rate and rhythm, S1, S2 normal, no murmur, click, rub or gallop GI: Soft, incisions healing well.  Occasional bowel sounds appreciated.  Slightly distended but no rigidity noted.  Lab Results:  Recent Labs    09/15/18 0609  WBC 7.8  HGB 11.4*  HCT 35.9*  PLT 231   BMET Recent Labs    09/15/18 0609 09/16/18 0621  NA 137 139  K 4.1 4.5  CL 104 103  CO2 27 29  GLUCOSE 122* 109*  BUN 6* 6*  CREATININE 0.40* 0.44  CALCIUM 8.1* 8.9   PT/INR No results for input(s): LABPROT, INR in the last 72 hours.  Studies/Results: No results found.  Anti-infectives: Anti-infectives (From admission, onward)   Start     Dose/Rate Route Frequency Ordered Stop   09/12/18 0600  cefoTEtan (CEFOTAN) 2 g in sodium chloride 0.9 % 100 mL IVPB     2 g 200 mL/hr over 30 Minutes Intravenous On call to O.R. 09/12/18 0049 09/12/18 0021      Assessment/Plan: s/p Procedure(s): LEFT INCARCERATED INGUINAL HERNIA REPAIR  EXPLORATORY LAPAROTOMY; SMALL BOWEL RESECTION Impression: Awaiting full return of bowel function.  Will try Dulcolax suppository.  Patient is ambulating.  LOS: 5 days    Aviva Signs 09/17/2018

## 2018-09-17 NOTE — Progress Notes (Signed)
IV removed, patient tolerated well.  Reviewed AVS with patient and patient's daughter, Wells Guiles, both verbalized understanding.  Patient taken to lobby via wheelchair and transported home by her daughter.

## 2018-09-18 NOTE — Discharge Summary (Signed)
Physician Discharge Summary  Patient ID: Claire Knight MRN: [id number] DOB/AGE: 1955-09-14 63 y.o.  Admit date: 09/11/2018 Discharge date: 09/17/2018  Admission Diagnoses: Incarcerated left inguinal hernia, small bowel obstruction  Discharge Diagnoses: Same Active Problems:   Incarcerated left inguinal hernia Hypophosphatemia, hypokalemia  Discharged Condition: good  Hospital Course: Patient is a 62 year old white female who presents the emergency room on 09/11/2018 with an incarcerated left inguinal hernia.  She was taken to the operating room early in the morning on 09/12/2018 and underwent left inguinal herniorrhaphy and partial small bowel resection due to ischemic changes in the incarcerated left inguinal hernia.  She tolerated the surgery well.  Her postoperative course was remarkable for mild hypophosphatemia and hypokalemia, both of which were corrected.  Her diet was advanced once her bowel function returned.  She is being discharged home on 09/17/2018 in good and improving condition.  Treatments: surgery: Left inguinal herniorrhaphy, partial small bowel resection on 09/12/2018  Discharge Exam: Blood pressure 122/74, pulse 72, temperature 98.2 F (36.8 C), temperature source Oral, resp. rate 18, height 5\' 2"  (1.575 m), weight 72.6 kg, SpO2 97 %. General appearance: alert, cooperative and no distress Resp: clear to auscultation bilaterally Cardio: regular rate and rhythm, S1, S2 normal, no murmur, click, rub or gallop GI: Soft, incisions healing well.  Bowel sounds active.  Disposition: Home  Discharge Instructions    Diet - low sodium heart healthy   Complete by: As directed    Increase activity slowly   Complete by: As directed      Allergies as of 09/17/2018      Reactions   Clindamycin/lincomycin Other (See Comments)   Body aches    Sulfa Antibiotics Other (See Comments)   Hallucinations      Medication List    TAKE these medications   atorvastatin 40 MG  tablet Commonly known as: LIPITOR Take 40 mg by mouth daily.   estradiol 0.5 MG tablet Commonly known as: ESTRACE Take 0.5 mg by mouth daily.   levothyroxine 112 MCG tablet Commonly known as: SYNTHROID Take 112 mcg by mouth daily before breakfast.   losartan-hydrochlorothiazide 100-25 MG tablet Commonly known as: HYZAAR Take 1 tablet by mouth daily.   omeprazole 20 MG capsule Commonly known as: PRILOSEC Take 20 mg by mouth daily.   traMADol 50 MG tablet Commonly known as: Ultram Take 1 tablet (50 mg total) by mouth every 6 (six) hours as needed.      Follow-up Information    Virl Cagey, MD. Schedule an appointment as soon as possible for a visit on 09/29/2018.   Specialty: General Surgery Contact information: [address] [facility] [address] [phone]           Signed: Aviva Signs 09/18/2018, 8:03 AM

## 2018-09-23 ENCOUNTER — Telehealth: Payer: Self-pay

## 2018-09-23 NOTE — Telephone Encounter (Signed)
Patient called regarding bleeding/drainage from the bottom of incision between staples. Will notify MD. Patient aware.

## 2018-09-24 ENCOUNTER — Other Ambulatory Visit: Payer: Self-pay

## 2018-09-24 ENCOUNTER — Encounter: Payer: Self-pay | Admitting: General Surgery

## 2018-09-24 ENCOUNTER — Ambulatory Visit (INDEPENDENT_AMBULATORY_CARE_PROVIDER_SITE_OTHER): Payer: Self-pay | Admitting: General Surgery

## 2018-09-24 VITALS — BP 113/79 | HR 83 | Temp 96.9°F | Resp 16 | Ht 62.5 in | Wt 149.0 lb

## 2018-09-24 DIAGNOSIS — Z09 Encounter for follow-up examination after completed treatment for conditions other than malignant neoplasm: Secondary | ICD-10-CM

## 2018-09-24 NOTE — Progress Notes (Signed)
Subjective:     Claire Knight  Patient presented unexpectedly for evaluation of bloody drainage.  This is coming from her left inguinal hernia repair site.  She is not lightheaded.  No purulent drainage has been noted. Objective:    BP 113/79 (BP Location: Left Arm, Patient Position: Sitting, Cuff Size: Normal)   Pulse 83   Temp (!) 96.9 F (36.1 C) (Tympanic)   Resp 16   Ht 5' 2.5" (1.588 m)   Wt 149 lb (67.6 kg)   SpO2 97%   BMI 26.82 kg/m   General:  alert, cooperative and no distress  Abdomen is soft with a well-healed midline surgical scar present.  Minimal old blood was expressed from the lateral aspect of her left inguinal incision.  The staples are still in place.    Assessment:    Resolving hematoma of left inguinal incision.  No infection present.    Plan:   Reassured patient that this will resolve over the next few days.  She is to see Dr. Constance Haw next week for routine follow-up.  Keep the wound clean and dry with soap and water.

## 2018-10-01 ENCOUNTER — Encounter: Payer: Self-pay | Admitting: General Surgery

## 2018-10-01 ENCOUNTER — Ambulatory Visit (INDEPENDENT_AMBULATORY_CARE_PROVIDER_SITE_OTHER): Payer: Self-pay | Admitting: General Surgery

## 2018-10-01 ENCOUNTER — Other Ambulatory Visit: Payer: Self-pay

## 2018-10-01 VITALS — BP 94/62 | HR 85 | Temp 96.9°F | Resp 16 | Ht 62.5 in | Wt 148.0 lb

## 2018-10-01 DIAGNOSIS — K56609 Unspecified intestinal obstruction, unspecified as to partial versus complete obstruction: Secondary | ICD-10-CM

## 2018-10-01 DIAGNOSIS — S301XXA Contusion of abdominal wall, initial encounter: Secondary | ICD-10-CM | POA: Insufficient documentation

## 2018-10-01 DIAGNOSIS — K4030 Unilateral inguinal hernia, with obstruction, without gangrene, not specified as recurrent: Secondary | ICD-10-CM

## 2018-10-01 MED ORDER — AMOXICILLIN-POT CLAVULANATE 875-125 MG PO TABS: 1.0000 | ORAL_TABLET | Freq: Two times a day (BID) | ORAL | 0 refills | Status: AC

## 2018-10-01 NOTE — Patient Instructions (Signed)
Pack wound with saline dampened gauze and cover with gauze and tape daily to twice daily.  Take antibiotic as prescribed.  Steri strips will fall off. Shower per routine, ok to shower with bandage on or off. Pat dry and replace gauze after showering.

## 2018-10-01 NOTE — Progress Notes (Signed)
[facility] Note   HPI:  63 y.o. Female presents to clinic for follow-up evaluation after surgery. She underwent left inguinal hernia repair and ex lap, SBR for incarcerated, strangulated left inguinal hernia. She was seen by Dr. Arnoldo Morale a week or so ago with some drainage from the left groin incision and it looked like a hematoma. She says there is still some drainage and it is foul smelling now.  She otherwise is well without complaints. Pain is controlled.   Review of Systems:  Eating diet Having regular Bms No fever +drainage and minor redness at left groin incision  All other review of systems: otherwise negative   Vital Signs:  BP 94/62 (BP Location: Left Arm, Patient Position: Sitting, Cuff Size: Normal)   Pulse 85   Temp (!) 96.9 F (36.1 C) (Tympanic)   Resp 16   Ht 5' 2.5" (1.588 m)   Wt 148 lb (67.1 kg)   SpO2 97%   BMI 26.64 kg/m    Physical Exam:  Physical Exam Vitals signs reviewed.  HENT:     Head: Normocephalic.  Cardiovascular:     Rate and Rhythm: Normal rate.  Pulmonary:     Effort: Pulmonary effort is normal.  Abdominal:     General: There is no distension.     Palpations: Abdomen is soft.     Tenderness: There is no abdominal tenderness.     Hernia: A hernia is present.     Comments: Midline incision healed, staples removed, minor erythema at staple insertion, no erythema extending out, no drainage, left groin incision with some erythema on edges, and some drainage, staples removed and hematoma evacuated, mid portion with cavity  Musculoskeletal: Normal range of motion.        General: No swelling.  Neurological:     Mental Status: She is alert.     Assessment:  63 y.o. yo Female with what looks like a hematoma that is likely now infected given her redness and question of foul smell. She is overall doing well. The staples are out now and packing has been placed to the opening in the left groin incision.   Plan:  Pack wound with  saline dampened gauze and cover with gauze and tape daily to twice daily.  Take antibiotic as prescribed.; augmentin BID sent in  Steri strips will fall off from midline  Shower per routine, ok to shower with bandage on or off. Pat dry and replace gauze after showering.  -Wound check next week   Future Appointments  Date Time Provider Ullin  10/08/2018  3:15 PM Virl Cagey, MD [facility] None    All of the above recommendations were discussed with the patient and patient's family, and all of patient's and family's questions were answered to their expressed satisfaction.  Curlene Labrum, MD [facility] [address] [place], [place] [address] [phone] (office)

## 2018-10-08 ENCOUNTER — Ambulatory Visit (INDEPENDENT_AMBULATORY_CARE_PROVIDER_SITE_OTHER): Payer: Self-pay | Admitting: General Surgery

## 2018-10-08 ENCOUNTER — Other Ambulatory Visit: Payer: Self-pay

## 2018-10-08 ENCOUNTER — Encounter: Payer: Self-pay | Admitting: General Surgery

## 2018-10-08 VITALS — BP 119/79 | HR 86 | Temp 96.8°F | Resp 16 | Ht 62.5 in | Wt 150.0 lb

## 2018-10-08 DIAGNOSIS — S301XXA Contusion of abdominal wall, initial encounter: Secondary | ICD-10-CM

## 2018-10-08 NOTE — Patient Instructions (Signed)
Continue to pack wound at least once a day. If unable to pack, at least probe with small q tip and make sure not closing up prematurely.  Cover with gauze for drainage. Monitored for any redness, swelling, purulent drainage.

## 2018-10-09 NOTE — Progress Notes (Signed)
[facility] Note   HPI:  63 y.o. Female presents to clinic for follow-up evaluation of of her left groin wound that had a hematoma that was evacuated. She says she is having minimal discharge. She is completing her antibiotics.   Review of Systems:  No fevers or chills Minimal discharge  All other review of systems: otherwise negative   Vital Signs:  BP 119/79 (BP Location: Left Arm, Patient Position: Sitting, Cuff Size: Normal)   Pulse 86   Temp (!) 96.8 F (36 C) (Tympanic)   Resp 16   Ht 5' 2.5" (1.588 m)   Wt 150 lb (68 kg)   SpO2 97%   BMI 27.00 kg/m    Physical Exam:  Physical Exam Vitals signs reviewed.  HENT:     Head: Normocephalic.  Cardiovascular:     Rate and Rhythm: Normal rate.  Pulmonary:     Effort: Pulmonary effort is normal.  Abdominal:     General: There is no distension.     Palpations: Abdomen is soft.     Tenderness: There is no abdominal tenderness.     Hernia: No hernia is present.     Comments: Midline healing  Musculoskeletal: Normal range of motion.     Comments: Left groin incision with 1cm opening, probed and minimal drainage, able to pack with gauze, no erythema  Neurological:     Mental Status: She is alert.     Assessment:  63 y.o. yo Female with a left groin wound that is healing. Infection appears to be clearing. She still needs to pack the wound at this time to prevent it from closing prematurely.  Plan:  Continue to pack wound at least once a day. If unable to pack, at least probe with small q tip and make sure not closing up prematurely.  Cover with gauze for drainage. Monitored for any redness, swelling, purulent drainage.   Future Appointments  Date Time Provider Magna  10/22/2018  1:00 PM Virl Cagey, MD [facility] None     All of the above recommendations were discussed with the patient, and all of patient's questions were answered to her expressed satisfaction.  Curlene Labrum,  MD [facility] [address] [place], [place] [address] [phone] (office)

## 2018-10-22 ENCOUNTER — Encounter: Payer: Self-pay | Admitting: General Surgery

## 2018-10-22 ENCOUNTER — Ambulatory Visit (INDEPENDENT_AMBULATORY_CARE_PROVIDER_SITE_OTHER): Payer: Self-pay | Admitting: General Surgery

## 2018-10-22 ENCOUNTER — Other Ambulatory Visit: Payer: Self-pay

## 2018-10-22 ENCOUNTER — Encounter (INDEPENDENT_AMBULATORY_CARE_PROVIDER_SITE_OTHER): Payer: Self-pay

## 2018-10-22 VITALS — BP 128/85 | HR 88 | Temp 97.7°F | Resp 16 | Ht 62.5 in | Wt 150.0 lb

## 2018-10-22 DIAGNOSIS — S301XXA Contusion of abdominal wall, initial encounter: Secondary | ICD-10-CM

## 2018-10-22 DIAGNOSIS — K56609 Unspecified intestinal obstruction, unspecified as to partial versus complete obstruction: Secondary | ICD-10-CM

## 2018-10-22 DIAGNOSIS — K4030 Unilateral inguinal hernia, with obstruction, without gangrene, not specified as recurrent: Secondary | ICD-10-CM

## 2018-10-22 NOTE — Progress Notes (Signed)
[facility] Note   HPI:  63 y.o. Female presents to clinic for follow-up evaluation of the left groin wound. She is doing well and not having to pack the area.   Review of Systems:  No drainage, no redness No pain All other review of systems: otherwise negative   Vital Signs:  BP 128/85 (BP Location: Left Arm, Patient Position: Sitting, Cuff Size: Normal)   Pulse 88   Temp 97.7 F (36.5 C) (Tympanic)   Resp 16   Ht 5' 2.5" (1.588 m)   Wt 150 lb (68 kg)   SpO2 97%   BMI 27.00 kg/m    Physical Exam:  Physical Exam Vitals signs reviewed.  Cardiovascular:     Rate and Rhythm: Normal rate.  Pulmonary:     Effort: Pulmonary effort is normal.  Abdominal:     General: There is no distension.     Palpations: Abdomen is soft.     Tenderness: There is no abdominal tenderness.     Hernia: No hernia is present.     Comments: Midline incision healed and no signs of hernia, left groin almost healed, central opening is smaller and skin almost sealed, no drainage, no hernia appreciated   Neurological:     Mental Status: She is alert.     Assessment:  63 y.o. yo Female with a history of incarcerated, strangulated L inguinal hernia s/p Ex lap and SBR and primary left inguinal hernia repair. Doing well after having to pack the left groin wound.  Plan:  No lifting > 10 lbs or excessive bending, pushing, pulling, or squatting until 8 weeks after surgery. Monitor wound to make sure completely closed before submerging in water. Follow up PRN  Return if concern for hernia recurrence   All of the above recommendations were discussed with the patient, and all of patient's questions were answered to her expressed satisfaction.  Curlene Labrum, MD [facility] [address] [place], [place] [address] [phone] (office)

## 2018-10-22 NOTE — Patient Instructions (Signed)
No lifting > 10 lbs or excessive bending, pushing, pulling, or squatting until 8 weeks after surgery. Monitor wound to make sure completely closed before submerging in water.

## 2019-06-11 ENCOUNTER — Inpatient Hospital Stay (HOSPITAL_COMMUNITY)
Admission: EM | Admit: 2019-06-11 | Discharge: 2019-06-13 | DRG: 373 | Disposition: A | Discharge status: Home / Self Care | Payer: No Typology Code available for payment source | Attending: Internal Medicine | Admitting: Internal Medicine

## 2019-06-11 ENCOUNTER — Encounter (HOSPITAL_COMMUNITY): Payer: Self-pay | Admitting: *Deleted

## 2019-06-11 ENCOUNTER — Emergency Department (HOSPITAL_COMMUNITY): Payer: No Typology Code available for payment source

## 2019-06-11 ENCOUNTER — Other Ambulatory Visit: Payer: Self-pay

## 2019-06-11 DIAGNOSIS — Z79899 Other long term (current) drug therapy: Secondary | ICD-10-CM

## 2019-06-11 DIAGNOSIS — K219 Gastro-esophageal reflux disease without esophagitis: Secondary | ICD-10-CM | POA: Diagnosis present

## 2019-06-11 DIAGNOSIS — Z90710 Acquired absence of both cervix and uterus: Secondary | ICD-10-CM

## 2019-06-11 DIAGNOSIS — Z20822 Contact with and (suspected) exposure to covid-19: Secondary | ICD-10-CM | POA: Diagnosis present

## 2019-06-11 DIAGNOSIS — E876 Hypokalemia: Secondary | ICD-10-CM | POA: Diagnosis present

## 2019-06-11 DIAGNOSIS — R197 Diarrhea, unspecified: Secondary | ICD-10-CM

## 2019-06-11 DIAGNOSIS — E785 Hyperlipidemia, unspecified: Secondary | ICD-10-CM | POA: Diagnosis present

## 2019-06-11 DIAGNOSIS — I10 Essential (primary) hypertension: Secondary | ICD-10-CM | POA: Diagnosis present

## 2019-06-11 DIAGNOSIS — E039 Hypothyroidism, unspecified: Secondary | ICD-10-CM | POA: Diagnosis present

## 2019-06-11 DIAGNOSIS — Z8673 Personal history of transient ischemic attack (TIA), and cerebral infarction without residual deficits: Secondary | ICD-10-CM

## 2019-06-11 DIAGNOSIS — K529 Noninfective gastroenteritis and colitis, unspecified: Secondary | ICD-10-CM

## 2019-06-11 DIAGNOSIS — K5900 Constipation, unspecified: Secondary | ICD-10-CM | POA: Diagnosis present

## 2019-06-11 DIAGNOSIS — A045 Campylobacter enteritis: Principal | ICD-10-CM | POA: Diagnosis present

## 2019-06-11 DIAGNOSIS — D751 Secondary polycythemia: Secondary | ICD-10-CM | POA: Diagnosis present

## 2019-06-11 DIAGNOSIS — E860 Dehydration: Secondary | ICD-10-CM | POA: Diagnosis present

## 2019-06-11 DIAGNOSIS — Z79890 Hormone replacement therapy (postmenopausal): Secondary | ICD-10-CM

## 2019-06-11 HISTORY — DX: Hyperlipidemia, unspecified: E78.5

## 2019-06-11 LAB — URINALYSIS, ROUTINE W REFLEX MICROSCOPIC
Bilirubin Urine: NEGATIVE
Glucose, UA: NEGATIVE mg/dL
Ketones, ur: 20 mg/dL — AB
Leukocytes,Ua: NEGATIVE
Nitrite: NEGATIVE
Protein, ur: NEGATIVE mg/dL
Specific Gravity, Urine: 1.013 (ref 1.005–1.030)
pH: 5 (ref 5.0–8.0)

## 2019-06-11 LAB — SARS CORONAVIRUS 2 BY RT PCR (HOSPITAL ORDER, PERFORMED IN ~~LOC~~ HOSPITAL LAB): SARS Coronavirus 2: NEGATIVE

## 2019-06-11 LAB — LIPASE, BLOOD: Lipase: 23 U/L (ref 11–51)

## 2019-06-11 LAB — COMPREHENSIVE METABOLIC PANEL
ALT: 22 U/L (ref 0–44)
AST: 21 U/L (ref 15–41)
Albumin: 3.3 g/dL — ABNORMAL LOW (ref 3.5–5.0)
Alkaline Phosphatase: 125 U/L (ref 38–126)
Anion gap: 15 (ref 5–15)
BUN: 19 mg/dL (ref 8–23)
CO2: 27 mmol/L (ref 22–32)
Calcium: 9.4 mg/dL (ref 8.9–10.3)
Chloride: 92 mmol/L — ABNORMAL LOW (ref 98–111)
Creatinine, Ser: 0.82 mg/dL (ref 0.44–1.00)
GFR calc Af Amer: >60 mL/min (ref >60)
GFR calc non Af Amer: >60 mL/min (ref >60)
Glucose, Bld: 113 mg/dL — ABNORMAL HIGH (ref 70–99)
Potassium: 2.3 mmol/L — CL (ref 3.5–5.1)
Sodium: 134 mmol/L — ABNORMAL LOW (ref 135–145)
Total Bilirubin: 0.6 mg/dL (ref 0.3–1.2)
Total Protein: 7.1 g/dL (ref 6.5–8.1)

## 2019-06-11 LAB — CBC WITH DIFFERENTIAL/PLATELET
Abs Immature Granulocytes: 0.37 10*3/uL — ABNORMAL HIGH (ref 0.00–0.07)
Basophils Absolute: 0.1 10*3/uL (ref 0.0–0.1)
Basophils Relative: 1 %
Eosinophils Absolute: 0 10*3/uL (ref 0.0–0.5)
Eosinophils Relative: 0 %
HCT: 47 % — ABNORMAL HIGH (ref 36.0–46.0)
Hemoglobin: 16 g/dL — ABNORMAL HIGH (ref 12.0–15.0)
Immature Granulocytes: 4 %
Lymphocytes Relative: 25 %
Lymphs Abs: 2.1 10*3/uL (ref 0.7–4.0)
MCH: 31.3 pg (ref 26.0–34.0)
MCHC: 34 g/dL (ref 30.0–36.0)
MCV: 91.8 fL (ref 80.0–100.0)
Monocytes Absolute: 0.9 10*3/uL (ref 0.1–1.0)
Monocytes Relative: 11 %
Neutro Abs: 5 10*3/uL (ref 1.7–7.7)
Neutrophils Relative %: 59 %
Platelets: 279 10*3/uL (ref 150–400)
RBC: 5.12 MIL/uL — ABNORMAL HIGH (ref 3.87–5.11)
RDW: 12.4 % (ref 11.5–15.5)
WBC: 8.5 10*3/uL (ref 4.0–10.5)
nRBC: 0 % (ref 0.0–0.2)

## 2019-06-11 LAB — MAGNESIUM: Magnesium: 1.7 mg/dL (ref 1.7–2.4)

## 2019-06-11 MED ORDER — SODIUM CHLORIDE 0.9 % IV BOLUS
1000.0000 mL | Freq: Once | INTRAVENOUS | Status: AC
  Administered 2019-06-11: 1000 mL via INTRAVENOUS

## 2019-06-11 MED ORDER — POTASSIUM CHLORIDE 10 MEQ/100ML IV SOLN
10.0000 meq | INTRAVENOUS | Status: AC
  Administered 2019-06-11 (×3): 10 meq via INTRAVENOUS
  Filled 2019-06-11 (×3): qty 100

## 2019-06-11 MED ORDER — CIPROFLOXACIN IN D5W 400 MG/200ML IV SOLN
400.0000 mg | Freq: Once | INTRAVENOUS | Status: AC
  Administered 2019-06-11: 400 mg via INTRAVENOUS
  Filled 2019-06-11: qty 200

## 2019-06-11 MED ORDER — ONDANSETRON HCL 4 MG/2ML IJ SOLN
4.0000 mg | Freq: Once | INTRAMUSCULAR | Status: AC
  Administered 2019-06-11: 4 mg via INTRAVENOUS
  Filled 2019-06-11: qty 2

## 2019-06-11 MED ORDER — IOHEXOL 300 MG/ML  SOLN
100.0000 mL | Freq: Once | INTRAMUSCULAR | Status: AC | PRN
  Administered 2019-06-11: 100 mL via INTRAVENOUS

## 2019-06-11 MED ORDER — POTASSIUM CHLORIDE CRYS ER 20 MEQ PO TBCR
40.0000 meq | EXTENDED_RELEASE_TABLET | Freq: Once | ORAL | Status: AC
  Administered 2019-06-11: 40 meq via ORAL
  Filled 2019-06-11: qty 2

## 2019-06-11 MED ORDER — ACETAMINOPHEN 650 MG RE SUPP: 650.0000 mg | Freq: Four times a day (QID) | RECTAL | Status: DC | PRN

## 2019-06-11 MED ORDER — MAGNESIUM SULFATE IN D5W 1-5 GM/100ML-% IV SOLN
1.0000 g | Freq: Once | INTRAVENOUS | Status: AC
  Administered 2019-06-11: 1 g via INTRAVENOUS
  Filled 2019-06-11: qty 100

## 2019-06-11 MED ORDER — PROCHLORPERAZINE EDISYLATE 10 MG/2ML IJ SOLN: 5.0000 mg | INTRAMUSCULAR | Status: DC | PRN

## 2019-06-11 MED ORDER — ACETAMINOPHEN 325 MG PO TABS
650.0000 mg | ORAL_TABLET | Freq: Four times a day (QID) | ORAL | Status: DC | PRN
  Administered 2019-06-12 – 2019-06-13 (×2): 650 mg via ORAL
  Filled 2019-06-11 (×2): qty 2

## 2019-06-11 MED ORDER — POTASSIUM CHLORIDE IN NACL 40-0.9 MEQ/L-% IV SOLN
INTRAVENOUS | Status: DC
  Administered 2019-06-12 (×2): 250 mL/h via INTRAVENOUS
  Filled 2019-06-11 (×2): qty 1000

## 2019-06-11 MED ORDER — METRONIDAZOLE IN NACL 5-0.79 MG/ML-% IV SOLN
500.0000 mg | Freq: Once | INTRAVENOUS | Status: AC
  Administered 2019-06-11: 500 mg via INTRAVENOUS
  Filled 2019-06-11: qty 100

## 2019-06-11 NOTE — ED Triage Notes (Addendum)
Diarrhea for a week, states she has lost 8 pounds

## 2019-06-11 NOTE — ED Provider Notes (Signed)
[facility] EMERGENCY DEPARTMENT Provider Note   CSN: [id number] Arrival date & time: 06/11/19  1548     History Chief Complaint  Patient presents with  . Diarrhea    Claire Knight is a 64 y.o. female with a history of arthritis, GERD, hypertension, thyroid disease presenting for evaluation of acute onset, persistent upper abdominal pain, nausea, diarrhea for 5 days.  Reports that symptoms began around Monday morning.  She reports upper abdominal pain which she describes as cramping and aching.  She has 6-7 episodes of watery nonbloody diarrhea daily.  She reports feeling early satiety and nausea and has had significantly decreased oral intake as a result.  She initially had some improvement in her nausea with taking Zofran at home.  She thinks that her symptoms may have started up after eating chicken that was out for a while.  She reports her family members had the chicken as well but did not eat as much as her.  She denies recent travel or recent treatment with antibiotics.  She has not had any fevers, chills, chest pain or shortness of breath.  Otherwise she has not tried anything else for her symptoms.  She has been trying to stay hydrated.  The history is provided by the patient.       Past Medical History:  Diagnosis Date  . Arthritis   . GERD (gastroesophageal reflux disease)   . Hyperlipidemia   . Hypertension   . Stroke [facility])    found old stroke on MRI recently. no deficets   . Thyroid disease     Patient Active Problem List   Diagnosis Date Noted  . Acute colitis 06/11/2019  . Hypertension   . GERD (gastroesophageal reflux disease)   . Hypothyroidism   . Polycythemia   . Hypokalemia   . Hyperlipidemia   . Hematoma of groin 10/01/2018  . Incarcerated left inguinal hernia   . SBO (small bowel obstruction) (HCC)     Past Surgical History:  Procedure Laterality Date  . ABDOMINAL HYSTERECTOMY    . BOWEL RESECTION  09/11/2018   Procedure: EXPLORATORY LAPAROTOMY;  SMALL BOWEL RESECTION;  Surgeon: Virl Cagey, MD;  Location: [facility];  Service: General;;  . INGUINAL HERNIA REPAIR Left 09/11/2018   Procedure: LEFT INCARCERATED INGUINAL HERNIA REPAIR ;  Surgeon: Virl Cagey, MD;  Location: [facility];  Service: General;  Laterality: Left;  . PLACEMENT OF BREAST IMPLANTS    . ROBOTIC ASSISTED LAPAROSCOPIC SACROCOLPOPEXY  11/2017     OB History   No obstetric history on file.     Family History  Problem Relation Age of Onset  . CAD Father   . Heart failure Father   . Breast cancer Sister   . Brain cancer Brother     Social History   Tobacco Use  . Smoking status: Never Smoker  . Smokeless tobacco: Never Used  Substance Use Topics  . Alcohol use: Yes    Comment: occ  . Drug use: Never    Home Medications Prior to Admission medications   Medication Sig Start Date End Date Taking? Authorizing Provider  atorvastatin (LIPITOR) 40 MG tablet Take 40 mg by mouth daily. 07/18/18  Yes [provider]  Cyanocobalamin (VITAMIN B12 PO) Take 1 tablet by mouth daily.   Yes [provider]  estradiol (ESTRACE) 0.5 MG tablet Take 0.5 mg by mouth daily.   Yes [provider]  levothyroxine (SYNTHROID) 112 MCG tablet Take 112 mcg by mouth daily before  breakfast.   Yes [provider]  losartan-hydrochlorothiazide (HYZAAR) 100-25 MG tablet Take 1 tablet by mouth daily.   Yes [provider]  Multiple Vitamin (MULTIVITAMIN WITH MINERALS) TABS tablet Take 1 tablet by mouth daily.   Yes [provider]  omeprazole (PRILOSEC) 20 MG capsule Take 20 mg by mouth daily.   Yes [provider]  VITAMIN D, CHOLECALCIFEROL, PO Take 1 capsule by mouth daily.   Yes [provider]  praziquantel (BILTRICIDE) 600 MG tablet Take 1 tablet by mouth as directed. Take 1 tablet when prescribed, then repeat in 14 days. After second dose, repeat in another 14 days. 05/14/19   [provider]     Allergies    Clindamycin/lincomycin and Sulfa antibiotics  Review of Systems   Review of Systems  Constitutional: Negative for chills and fever.  Respiratory: Negative for shortness of breath.   Cardiovascular: Negative for chest pain.  Gastrointestinal: Positive for abdominal pain, diarrhea and nausea. Negative for vomiting.  Genitourinary: Negative for dysuria, frequency and urgency.  All other systems reviewed and are negative.   Physical Exam Updated Vital Signs BP 113/67 (BP Location: Left Arm)   Pulse 84   Temp 98.2 F (36.8 C) (Oral)   Resp 16   Ht 5' 2.5" (1.588 m)   Wt 65.8 kg   SpO2 100%   BMI 26.11 kg/m   Physical Exam Vitals and nursing note reviewed.  Constitutional:      General: She is not in acute distress.    Appearance: She is well-developed.  HENT:     Head: Normocephalic and atraumatic.  Eyes:     General:        Right eye: No discharge.        Left eye: No discharge.     Conjunctiva/sclera: Conjunctivae normal.  Neck:     Vascular: No JVD.     Trachea: No tracheal deviation.  Cardiovascular:     Rate and Rhythm: Normal rate and regular rhythm.  Pulmonary:     Effort: Pulmonary effort is normal.     Breath sounds: Normal breath sounds.  Abdominal:     General: Abdomen is protuberant. Bowel sounds are increased. There is no distension.     Palpations: Abdomen is soft.     Tenderness: There is abdominal tenderness in the epigastric area. There is no right CVA tenderness, left CVA tenderness, guarding or rebound.  Musculoskeletal:     Cervical back: Neck supple.  Skin:    General: Skin is warm and dry.     Findings: No erythema.  Neurological:     Mental Status: She is alert.  Psychiatric:        Behavior: Behavior normal.     ED Results / Procedures / Treatments   Labs (all labs ordered are listed, but only abnormal results are displayed) Labs Reviewed  COMPREHENSIVE METABOLIC PANEL - Abnormal; Notable for the following  components:      Result Value   Sodium 134 (*)    Potassium 2.3 (*)    Chloride 92 (*)    Glucose, Bld 113 (*)    Albumin 3.3 (*)    All other components within normal limits  CBC WITH DIFFERENTIAL/PLATELET - Abnormal; Notable for the following components:   RBC 5.12 (*)    Hemoglobin 16.0 (*)    HCT 47.0 (*)    Abs Immature Granulocytes 0.37 (*)    All other components within normal limits  URINALYSIS, ROUTINE W REFLEX  MICROSCOPIC - Abnormal; Notable for the following components:   APPearance HAZY (*)    Hgb urine dipstick SMALL (*)    Ketones, ur 20 (*)    Bacteria, UA RARE (*)    All other components within normal limits  SARS CORONAVIRUS 2 BY RT PCR (HOSPITAL ORDER, PERFORMED IN [facility])  C DIFFICILE QUICK SCREEN W PCR REFLEX  GASTROINTESTINAL PANEL BY PCR, STOOL (REPLACES STOOL CULTURE)  LIPASE, BLOOD  MAGNESIUM  HIV ANTIBODY (ROUTINE TESTING W REFLEX)  BASIC METABOLIC PANEL  CBC WITH DIFFERENTIAL/PLATELET    EKG EKG Interpretation  Date/Time:  Friday Jun 11 2019 20:15:16 EDT Ventricular Rate:  83 PR Interval:    QRS Duration: 96 QT Interval:  371 QTC Calculation: 436 R Axis:   18 Text Interpretation: Sinus rhythm Low voltage, precordial leads Confirmed by Bethann Berkshire [phone]) on 06/11/2019 9:31:36 PM   Radiology CT ABDOMEN PELVIS W CONTRAST  Result Date: 06/11/2019 CLINICAL DATA:  Left side abdominal pain, flank pain. EXAM: CT ABDOMEN AND PELVIS WITH CONTRAST TECHNIQUE: Multidetector CT imaging of the abdomen and pelvis was performed using the standard protocol following bolus administration of intravenous contrast. CONTRAST:  OMNIPAQUE IOHEXOL 300 MG/ML  SOLN COMPARISON:  09/11/2018 FINDINGS: Lower chest: Moderate-sized hiatal hernia.  No acute abnormality. Hepatobiliary: No focal hepatic abnormality. Gallbladder unremarkable. Pancreas: No focal abnormality or ductal dilatation. Spleen: No focal abnormality.  Normal size. Adrenals/Urinary  Tract: No adrenal abnormality. No focal renal abnormality. No stones or hydronephrosis. Urinary bladder is unremarkable. Stomach/Bowel: Wall thickening seen within the cecum and ascending colon compatible with colitis. Left colonic diverticulosis. No evidence of bowel obstruction. Vascular/Lymphatic: No evidence of aneurysm or adenopathy. Reproductive: Prior hysterectomy.  No adnexal masses. Other: No free fluid or free air. Musculoskeletal: No acute bony abnormality. IMPRESSION: Fall thickening within the cecum and ascending colon compatible with colitis. Left colonic diverticulosis. Moderate-sized hiatal hernia. Electronically Signed   By: Charlett Nose M.D.   On: 06/11/2019 20:13    Procedures Procedures (including critical care time)  Medications Ordered in ED Medications  0.9 % NaCl with KCl 40 mEq / L  infusion (250 mL/hr Intravenous New Bag/Given 06/12/19 0028)  prochlorperazine (COMPAZINE) injection 5 mg (has no administration in time range)  acetaminophen (TYLENOL) tablet 650 mg (has no administration in time range)    Or  acetaminophen (TYLENOL) suppository 650 mg (has no administration in time range)  levothyroxine (SYNTHROID) tablet 112 mcg (has no administration in time range)  atorvastatin (LIPITOR) tablet 40 mg (has no administration in time range)  pantoprazole (PROTONIX) EC tablet 40 mg (has no administration in time range)  estradiol (ESTRACE) tablet 0.5 mg (has no administration in time range)  metroNIDAZOLE (FLAGYL) IVPB 500 mg (has no administration in time range)  ciprofloxacin (CIPRO) IVPB 400 mg (has no administration in time range)  ondansetron (ZOFRAN) injection 4 mg (4 mg Intravenous Given 06/11/19 1852)  sodium chloride 0.9 % bolus 1,000 mL (0 mLs Intravenous Stopped 06/11/19 2106)  potassium chloride 10 mEq in 100 mL IVPB (0 mEq Intravenous Stopped 06/12/19 0002)  iohexol (OMNIPAQUE) 300 MG/ML solution 100 mL (100 mLs Intravenous Contrast Given 06/11/19 1948)  potassium  chloride SA (KLOR-CON) CR tablet 40 mEq (40 mEq Oral Given 06/11/19 2032)  sodium chloride 0.9 % bolus 1,000 mL (1,000 mLs Intravenous New Bag/Given 06/11/19 2032)  magnesium sulfate IVPB 1 g 100 mL (0 g Intravenous Stopped 06/11/19 2232)  ciprofloxacin (CIPRO) IVPB 400 mg (400 mg Intravenous New Bag/Given  06/11/19 2203)  metroNIDAZOLE (FLAGYL) IVPB 500 mg (500 mg Intravenous New Bag/Given 06/11/19 2203)    ED Course  I have reviewed the triage vital signs and the nursing notes.  Pertinent labs & imaging results that were available during my care of the patient were reviewed by me and considered in my medical decision making (see chart for details).    MDM Rules/Calculators/A&P                      Patient presenting for evaluation of 5-day history of upper abdominal pain, nausea, decreased oral intake, and diarrhea.  Reportedly symptoms began after eating chicken that she thought may have been out for too long.  She is afebrile, initially mildly tachycardic, vital signs otherwise stable.  She is resting comfortably on initial assessment but appears dehydrated, generally fatigued.  No rebound or guarding noted on examination of the abdomen initially.  Lab work reviewed and interpreted by myself shows evidence consistent with dehydration with elevated hemoglobin and hematocrit (today hemoglobin is 16, baseline around 11) no renal insufficiency but she is hypokalemic with a potassium of 2.3.  Magnesium is lower limit of normal.  We will give IV and p.o. potassium as well as IV magnesium.  EKG shows normal sinus rhythm, no significant concerning abnormalities, no QT prolongation.  UA shows evidence of dehydration with ketones but is not concerning for UTI or nephrolithiasis.  CT scan obtained shows evidence consistent with colitis, no evidence of acute surgical abdominal pathology.  She was given IV fluids in the ED as well as antiemetics.  Will start on IV Cipro and Flagyl for treatment of colitis.   Given the degree of dehydration and hypokalemia noted on work-up today I feel she would benefit from admission to the hospital for further evaluation and management.  Spoke with Dr. Robb Matar with [facility] hospitalist service who agrees to assume care of patient and bring the patient into the hospital for further evaluation and management.   Final Clinical Impression(s) / ED Diagnoses Final diagnoses:  Diarrhea of presumed infectious origin  Colitis  Dehydration  Hypokalemia    Rx / DC Orders ED Discharge Orders    None       Jeanie Sewer, PA-C 06/12/19 0054    Bethann Berkshire, MD 06/12/19 [phone]

## 2019-06-11 NOTE — H&P (Signed)
History and Physical    Claire Knight MPN:[id number] DOB: 1955-09-07 DOA: 06/11/2019  PCP: Isabella Bowens, MD   Patient coming from: Home.  I have personally briefly reviewed patient's old medical records in [facility]  Chief Complaint: Diarrhea.  HPI: Claire Knight is a 64 y.o. female with medical history significant of ulcer colitis, GERD, hypertension, hyperlipidemia, history of nonhemorrhagic stroke with no residual deficits found on MRI, hypothyroidism who is coming to the emergency department due to diarrhea for 5 days associated with an 8 pound weight loss, fatigue, lightheadedness, palpitations, nausea, but no emesis after she ate some chicken and always out for a while. Her husband and daughter ate from it as well, but the husband only ate a small amount and did not get any symptoms.  Her daughter had some epigastric abdominal discomfort, but she did not eat as much chicken ice the patient did. She denies fever, chills, constipation, melena or hematochezia.  No dysuria, frequency or hematuria.  No sore throat, rhinorrhea, dyspnea, wheezing or hemoptysis.  No chest pain, palpitations, diaphoresis, PND, orthopnea or pitting edema of the lower extremities.   ED Course: Initial vital signs temperature 98.1 F, pulse 105, respirations 15, blood pressure 111/67 mmHg and O2 sat 99% on room air.  She received 2000 mL of NS bolus, Zofran 4 mg IVP, ciprofloxacin 400 mg IV, metronidazole 500 mg IV, magnesium sulfate 2 g IVPB, 40 mEq of KCl orally and 3 bags of KCl 10 mEq.  Urinalysis was hazy with small hemoglobinuria, ketonuria 20 mg/dL and rare bacteria on microscopic examination.  CBC shows a white count of 8.5, hemoglobin 16.0 g/dL and platelets 008.  9 months ago her baseline hemoglobin was around 11+ gr/dL.  Lipase was 23.  CMP shows a sodium 134, potassium 2.3 and chloride 92 mmol/L.  CO2 and calcium were within normal limits.  Renal function was normal.  Glucose 113 mg/dL.   LFTs were within expected range, except for an albumin level of 3.3 g/dL.  Imaging: CT abdomen/pelvis shows wall thickening within the second and ascending colon, compatible with colitis.  There is left colonic diverticulosis.  Moderate-sized hiatal hernia.  Review of Systems: As per HPI otherwise all other systems reviewed and are negative.  Past Medical History:  Diagnosis Date  . Arthritis   . GERD (gastroesophageal reflux disease)   . [place] Kitchen Hypertension Hyperlipidemia   . Stroke [facility])    found old stroke on MRI recently. no deficets   . Thyroid disease    Past Surgical History:  Procedure Laterality Date  . ABDOMINAL HYSTERECTOMY    . BOWEL RESECTION  09/11/2018   Procedure: EXPLORATORY LAPAROTOMY; SMALL BOWEL RESECTION;  Surgeon: Lucretia Roers, MD;  Location: [facility];  Service: General;;  . INGUINAL HERNIA REPAIR Left 09/11/2018   Procedure: LEFT INCARCERATED INGUINAL HERNIA REPAIR ;  Surgeon: Lucretia Roers, MD;  Location: [facility];  Service: General;  Laterality: Left;  . PLACEMENT OF BREAST IMPLANTS    . ROBOTIC ASSISTED LAPAROSCOPIC SACROCOLPOPEXY  11/2017   Social History  reports that she has never smoked. She has never used smokeless tobacco. She reports current alcohol use. She reports that she does not use drugs.  Allergies  Allergen Reactions  . Clindamycin/Lincomycin Other (See Comments)    Body aches   . Sulfa Antibiotics Other (See Comments)    Hallucinations   Family History  Problem Relation Age of Onset  . CAD Father   . Heart failure Father   .  Breast cancer Sister   . Brain cancer Brother    Prior to Admission medications   Medication Sig Start Date End Date Taking? Authorizing Provider  atorvastatin (LIPITOR) 40 MG tablet Take 40 mg by mouth daily. 07/18/18  Yes [provider]  Cyanocobalamin (VITAMIN B12 PO) Take 1 tablet by mouth daily.   Yes [provider]  estradiol (ESTRACE) 0.5 MG tablet Take 0.5 mg by mouth daily.   Yes  [provider]  levothyroxine (SYNTHROID) 112 MCG tablet Take 112 mcg by mouth daily before breakfast.   Yes [provider]  losartan-hydrochlorothiazide (HYZAAR) 100-25 MG tablet Take 1 tablet by mouth daily.   Yes [provider]  Multiple Vitamin (MULTIVITAMIN WITH MINERALS) TABS tablet Take 1 tablet by mouth daily.   Yes [provider]  omeprazole (PRILOSEC) 20 MG capsule Take 20 mg by mouth daily.   Yes [provider]  VITAMIN D, CHOLECALCIFEROL, PO Take 1 capsule by mouth daily.   Yes [provider]  praziquantel (BILTRICIDE) 600 MG tablet Take 1 tablet by mouth as directed. Take 1 tablet when prescribed, then repeat in 14 days. After second dose, repeat in another 14 days. 05/14/19   [provider]   Physical Exam: Vitals:   06/11/19 1631 06/11/19 1830 06/11/19 1900  BP: 111/67 134/67 112/73  Pulse: (!) 105  80  Resp: 15    Temp: 98.1 F (36.7 C)    TempSrc: Oral    SpO2: 99%  100%  Weight: 62.6 kg    Height: 5\' 2"  (1.575 m)     Constitutional: NAD, calm, comfortable Eyes: PERRL, lids and conjunctivae normal ENMT: Mucous membranes are dry. Posterior pharynx clear of any exudate or lesions. Neck: normal, supple, no masses, no thyromegaly Respiratory: clear to auscultation bilaterally, no wheezing, no crackles. Normal respiratory effort. No accessory muscle use.  Cardiovascular: Regular rate and rhythm, no murmurs / rubs / gallops. No extremity edema. 2+ pedal pulses. No carotid bruits.  Abdomen: Nondistended.  BS positive.  Soft, positive epigastric tenderness, no masses palpated. No hepatosplenomegaly. Musculoskeletal: no clubbing / cyanosis. Good ROM, no contractures. Normal muscle tone.  Skin: Decreased skin turgor. Neurologic: CN 2-12 grossly intact. Sensation intact, DTR normal. Strength 5/5 in all 4.  Psychiatric: Normal judgment and insight. Alert and oriented x 3. Normal mood.   Labs on Admission: I  have personally reviewed following labs and imaging studies  CBC: Recent Labs  Lab 06/11/19 1824  WBC 8.5  NEUTROABS 5.0  HGB 16.0*  HCT 47.0*  MCV 91.8  PLT 279    Basic Metabolic Panel: Recent Labs  Lab 06/11/19 1824  NA 134*  K 2.3*  CL 92*  CO2 27  GLUCOSE 113*  BUN 19  CREATININE 0.82  CALCIUM 9.4  MG 1.7    GFR: Estimated Creatinine Clearance: 60.3 mL/min (by C-G formula based on SCr of 0.82 mg/dL).  Liver Function Tests: Recent Labs  Lab 06/11/19 1824  AST 21  ALT 22  ALKPHOS 125  BILITOT 0.6  PROT 7.1  ALBUMIN 3.3*    Urine analysis:    Component Value Date/Time   COLORURINE YELLOW 06/11/2019 1922   APPEARANCEUR HAZY (A) 06/11/2019 1922   LABSPEC 1.013 06/11/2019 1922   PHURINE 5.0 06/11/2019 1922   GLUCOSEU NEGATIVE 06/11/2019 1922   HGBUR SMALL (A) 06/11/2019 1922   BILIRUBINUR NEGATIVE 06/11/2019 1922   KETONESUR 20 (A) 06/11/2019 1922   PROTEINUR NEGATIVE 06/11/2019 1922   NITRITE NEGATIVE 06/11/2019  [place] 06/11/2019 1922    Radiological Exams on Admission: CT ABDOMEN PELVIS W CONTRAST  Result Date: 06/11/2019 CLINICAL DATA:  Left side abdominal pain, flank pain. EXAM: CT ABDOMEN AND PELVIS WITH CONTRAST TECHNIQUE: Multidetector CT imaging of the abdomen and pelvis was performed using the standard protocol following bolus administration of intravenous contrast. CONTRAST:  143mL OMNIPAQUE IOHEXOL 300 MG/ML  SOLN COMPARISON:  09/11/2018 FINDINGS: Lower chest: Moderate-sized hiatal hernia.  No acute abnormality. Hepatobiliary: No focal hepatic abnormality. Gallbladder unremarkable. Pancreas: No focal abnormality or ductal dilatation. Spleen: No focal abnormality.  Normal size. Adrenals/Urinary Tract: No adrenal abnormality. No focal renal abnormality. No stones or hydronephrosis. Urinary bladder is unremarkable. Stomach/Bowel: Wall thickening seen within the cecum and ascending colon compatible with colitis. Left colonic  diverticulosis. No evidence of bowel obstruction. Vascular/Lymphatic: No evidence of aneurysm or adenopathy. Reproductive: Prior hysterectomy.  No adnexal masses. Other: No free fluid or free air. Musculoskeletal: No acute bony abnormality. IMPRESSION: Fall thickening within the cecum and ascending colon compatible with colitis. Left colonic diverticulosis. Moderate-sized hiatal hernia. Electronically Signed   By: Rolm Baptise M.D.   On: 06/11/2019 20:13    EKG: Independently reviewed.  Vent. rate 83 BPM PR interval * ms QRS duration 96 ms QT/QTc 371/436 ms P-R-T axes 72 18 30 Sinus rhythm Low voltage, precordial leads  Assessment/Plan Principal Problem:   Acute colitis Observation/telemetry. Continue IV fluids. Clear liquid diet. Check GI pathogen PCR. Continue ciprofloxacin and metronidazole for now. Analgesics as needed. Antiemetics as needed. Continue PPI.  Active Problems:   Polycythemia Secondary to hemoconcentration. Continue IV fluids.    Hypokalemia Replacing. Magnesium was supplemented. Follow potassium level.    Hypertension Hold Hyzaar. Monitor blood pressure. Monitor renal function electrolytes.    GERD (gastroesophageal reflux disease) Continue omeprazole or formulary equivalent.    Hypothyroidism Continue levothyroxine 112 mcg p.o. daily.    Hyperlipidemia Continue atorvastatin 40 mg p.o. daily.   DVT prophylaxis: SCDs. Code Status:   Full code. Family Communication:   Disposition Plan:   Patient is from:  Home.  Anticipated DC to:  Home.  Anticipated DC date:  06/12/2019.  Anticipated DC barriers: Clinical and lab parameters improvement.  Consults called: Admission status:  Observation/telemetry.   Severity of Illness: Medium severity.  Reubin Milan MD [facility]  How to contact the [facility] Attending or Consulting provider [place] or covering provider during after hours [place], for this patient?   1. Check the care team in  [facility] and look for a) attending/consulting [facility] provider listed and b) the [facility] team listed 2. Log into [email or web address] and use [place]'s universal password to access. If you do not have the password, please contact the hospital operator. 3. Locate the [facility] provider you are looking for under [facility] and page to a number that you can be directly reached. 4. If you still have difficulty reaching the provider, please page the [facility] (Director on Call) for the Hospitalists listed on amion for assistance.  06/11/2019, 10:31 PM   Document was prepared using Dragon voice recognition software and may contain some unintended transcription errors.

## 2019-06-11 NOTE — ED Notes (Signed)
Date and time results received: 06/11/19 1915  Test: Potassium Critical Value: 2.3  Name of Provider Notified: Dorene Grebe, NP  Orders Received? Or Actions Taken?: acknowledged

## 2019-06-12 DIAGNOSIS — D751 Secondary polycythemia: Secondary | ICD-10-CM

## 2019-06-12 DIAGNOSIS — I10 Essential (primary) hypertension: Secondary | ICD-10-CM

## 2019-06-12 DIAGNOSIS — Z79890 Hormone replacement therapy (postmenopausal): Secondary | ICD-10-CM | POA: Diagnosis not present

## 2019-06-12 DIAGNOSIS — E860 Dehydration: Secondary | ICD-10-CM

## 2019-06-12 DIAGNOSIS — Z79899 Other long term (current) drug therapy: Secondary | ICD-10-CM | POA: Diagnosis not present

## 2019-06-12 DIAGNOSIS — E039 Hypothyroidism, unspecified: Secondary | ICD-10-CM | POA: Diagnosis present

## 2019-06-12 DIAGNOSIS — K219 Gastro-esophageal reflux disease without esophagitis: Secondary | ICD-10-CM

## 2019-06-12 DIAGNOSIS — E876 Hypokalemia: Secondary | ICD-10-CM | POA: Diagnosis present

## 2019-06-12 DIAGNOSIS — E785 Hyperlipidemia, unspecified: Secondary | ICD-10-CM

## 2019-06-12 DIAGNOSIS — K529 Noninfective gastroenteritis and colitis, unspecified: Secondary | ICD-10-CM | POA: Diagnosis not present

## 2019-06-12 DIAGNOSIS — Z8673 Personal history of transient ischemic attack (TIA), and cerebral infarction without residual deficits: Secondary | ICD-10-CM | POA: Diagnosis not present

## 2019-06-12 DIAGNOSIS — K5900 Constipation, unspecified: Secondary | ICD-10-CM | POA: Diagnosis present

## 2019-06-12 DIAGNOSIS — Z90710 Acquired absence of both cervix and uterus: Secondary | ICD-10-CM | POA: Diagnosis not present

## 2019-06-12 DIAGNOSIS — Z20822 Contact with and (suspected) exposure to covid-19: Secondary | ICD-10-CM | POA: Diagnosis present

## 2019-06-12 DIAGNOSIS — A045 Campylobacter enteritis: Secondary | ICD-10-CM | POA: Diagnosis present

## 2019-06-12 LAB — CBC WITH DIFFERENTIAL/PLATELET
Abs Immature Granulocytes: 0.3 10*3/uL — ABNORMAL HIGH (ref 0.00–0.07)
Basophils Absolute: 0 10*3/uL (ref 0.0–0.1)
Basophils Relative: 1 %
Eosinophils Absolute: 0 10*3/uL (ref 0.0–0.5)
Eosinophils Relative: 0 %
HCT: 37.7 % (ref 36.0–46.0)
Hemoglobin: 12.6 g/dL (ref 12.0–15.0)
Immature Granulocytes: 5 %
Lymphocytes Relative: 24 %
Lymphs Abs: 1.5 10*3/uL (ref 0.7–4.0)
MCH: 31.5 pg (ref 26.0–34.0)
MCHC: 33.4 g/dL (ref 30.0–36.0)
MCV: 94.3 fL (ref 80.0–100.0)
Monocytes Absolute: 0.7 10*3/uL (ref 0.1–1.0)
Monocytes Relative: 11 %
Neutro Abs: 3.8 10*3/uL (ref 1.7–7.7)
Neutrophils Relative %: 59 %
Platelets: 237 10*3/uL (ref 150–400)
RBC: 4 MIL/uL (ref 3.87–5.11)
RDW: 12.5 % (ref 11.5–15.5)
WBC: 6.4 10*3/uL (ref 4.0–10.5)
nRBC: 0 % (ref 0.0–0.2)

## 2019-06-12 LAB — BASIC METABOLIC PANEL
Anion gap: 6 (ref 5–15)
BUN: 11 mg/dL (ref 8–23)
CO2: 25 mmol/L (ref 22–32)
Calcium: 8.2 mg/dL — ABNORMAL LOW (ref 8.9–10.3)
Chloride: 110 mmol/L (ref 98–111)
Creatinine, Ser: 0.59 mg/dL (ref 0.44–1.00)
GFR calc Af Amer: >60 mL/min (ref >60)
GFR calc non Af Amer: >60 mL/min (ref >60)
Glucose, Bld: 129 mg/dL — ABNORMAL HIGH (ref 70–99)
Potassium: 3.3 mmol/L — ABNORMAL LOW (ref 3.5–5.1)
Sodium: 141 mmol/L (ref 135–145)

## 2019-06-12 LAB — C DIFFICILE QUICK SCREEN W PCR REFLEX
C Diff antigen: NEGATIVE
C Diff interpretation: NOT DETECTED
C Diff toxin: NEGATIVE

## 2019-06-12 LAB — HIV ANTIBODY (ROUTINE TESTING W REFLEX): HIV Screen 4th Generation wRfx: NONREACTIVE

## 2019-06-12 MED ORDER — POTASSIUM CHLORIDE CRYS ER 20 MEQ PO TBCR
40.0000 meq | EXTENDED_RELEASE_TABLET | Freq: Once | ORAL | Status: AC
  Administered 2019-06-12: 40 meq via ORAL
  Filled 2019-06-12: qty 2

## 2019-06-12 MED ORDER — ATORVASTATIN CALCIUM 40 MG PO TABS
40.0000 mg | ORAL_TABLET | Freq: Every day | ORAL | Status: DC
  Administered 2019-06-12 – 2019-06-13 (×2): 40 mg via ORAL
  Filled 2019-06-12 (×2): qty 1

## 2019-06-12 MED ORDER — PANTOPRAZOLE SODIUM 40 MG PO TBEC
40.0000 mg | DELAYED_RELEASE_TABLET | Freq: Every day | ORAL | Status: DC
  Administered 2019-06-12 – 2019-06-13 (×2): 40 mg via ORAL
  Filled 2019-06-12 (×2): qty 1

## 2019-06-12 MED ORDER — LEVOTHYROXINE SODIUM 112 MCG PO TABS
112.0000 ug | ORAL_TABLET | Freq: Every day | ORAL | Status: DC
  Administered 2019-06-12 – 2019-06-13 (×2): 112 ug via ORAL
  Filled 2019-06-12 (×3): qty 1

## 2019-06-12 MED ORDER — METRONIDAZOLE IN NACL 5-0.79 MG/ML-% IV SOLN
500.0000 mg | Freq: Three times a day (TID) | INTRAVENOUS | Status: DC
  Administered 2019-06-12 – 2019-06-13 (×4): 500 mg via INTRAVENOUS
  Filled 2019-06-12 (×4): qty 100

## 2019-06-12 MED ORDER — CIPROFLOXACIN IN D5W 400 MG/200ML IV SOLN
400.0000 mg | Freq: Two times a day (BID) | INTRAVENOUS | Status: DC
  Administered 2019-06-13: 400 mg via INTRAVENOUS
  Filled 2019-06-12: qty 200

## 2019-06-12 MED ORDER — ESTRADIOL 0.5 MG PO TABS: 0.5000 mg | ORAL_TABLET | Freq: Every day | ORAL | Status: DC

## 2019-06-12 MED ORDER — ESTRADIOL 1 MG PO TABS
0.5000 mg | ORAL_TABLET | Freq: Every day | ORAL | Status: DC
  Administered 2019-06-12 – 2019-06-13 (×2): 0.5 mg via ORAL
  Filled 2019-06-12 (×2): qty 0.5
  Filled 2019-06-12 (×2): qty 1
  Filled 2019-06-12: qty 0.5

## 2019-06-12 NOTE — Progress Notes (Signed)
PROGRESS NOTE    Claire Knight  EHM:[id number] DOB: March 17, 1955 DOA: 06/11/2019 PCP: Isabella Bowens, MD    Chief Complaint  Patient presents with  . Diarrhea    Brief Narrative:  As per [name] on 06/11/2019 64 y.o. female with medical history significant of ulcer colitis, GERD, hypertension, hyperlipidemia, history of nonhemorrhagic stroke with no residual deficits found on MRI, hypothyroidism who is coming to the emergency department due to diarrhea for 5 days associated with an 8 pound weight loss, fatigue, lightheadedness, palpitations, nausea, but no emesis after she ate some chicken and always out for a while. Her husband and daughter ate from it as well, but the husband only ate a small amount and did not get any symptoms.  Her daughter had some epigastric abdominal discomfort, but she did not eat as much chicken ice the patient did. She denies fever, chills, constipation, melena or hematochezia.  No dysuria, frequency or hematuria.  No sore throat, rhinorrhea, dyspnea, wheezing or hemoptysis.  No chest pain, palpitations, diaphoresis, PND, orthopnea or pitting edema of the lower extremities.   ED Course: Initial vital signs temperature 98.1 F, pulse 105, respirations 15, blood pressure 111/67 mmHg and O2 sat 99% on room air.  She received 2000 mL of NS bolus, Zofran 4 mg IVP, ciprofloxacin 400 mg IV, metronidazole 500 mg IV, magnesium sulfate 2 g IVPB, 40 mEq of KCl orally and 3 bags of KCl 10 mEq.  Urinalysis was hazy with small hemoglobinuria, ketonuria 20 mg/dL and rare bacteria on microscopic examination.  CBC shows a white count of 8.5, hemoglobin 16.0 g/dL and platelets 366.  9 months ago her baseline hemoglobin was around 11+ gr/dL.  Lipase was 23.  CMP shows a sodium 134, potassium 2.3 and chloride 92 mmol/L.  CO2 and calcium were within normal limits.  Renal function was normal.  Glucose 113 mg/dL.  LFTs were within expected range, except for an  albumin level of 3.3 g/dL.  Assessment & Plan: 1-Acute colitis -still having diarrhea and abd discomfort -C. Diff neg -GI panel pending -continue current empiric antibiotic coverage and supportive care -maintain adequate oral hydration -advance diet as tolerated.  2-Hypertension -Blood pressure stable -Continue holding Hyzaar -Follow vital signs.  3-GERD (gastroesophageal reflux disease) -Continue PPI.  4-Hypothyroidism -Continue Synthroid.  5-dehydration/hemoconcentration/Polycythemia -Improved with fluids -continue monitoring  6-Hypokalemia -continue to replete as needed  -checking Mg in am -K better today -in the setting of GI loses  7-Hyperlipidemia -continue statins.  DVT prophylaxis: SCD's Code Status: Full Family Communication: No family at bedside.  Patient expressed that she will update her family. Disposition:   Status is: Observation  Dispo: The patient is from: Home              Anticipated d/c is to: Home              Anticipated d/c date is: 06/13/2019              Patient currently still not medically stable as she continued having ongoing diarrhea and abdominal discomfort; high risk to deteriorate especially if unable to keep up with a GI losses.     Consultants:   None  Procedures:  See below for x-ray reports  Antimicrobials:  Ciprofloxacin and Flagyl (empirical treatment for colitis; looking for a total of 10 days antibiotic treatment.)  Subjective: Afebrile currently no nausea vomiting.  Reports feeling better.  Still having diarrhea, gassy abdominal discomfort and obstipation  Objective: Vitals:  06/11/19 2322 06/11/19 2335 06/12/19 0439 06/12/19 0800  BP: 113/67  96/60 110/72  Pulse: 84  86 78  Resp: 16  14 18   Temp: 98.2 F (36.8 C)  98.6 F (37 C) 98.1 F (36.7 C)  TempSrc: Oral  Oral Oral  SpO2: 100%  98% 98%  Weight:  65.8 kg    Height:  5' 2.5" (1.588 m)      Intake/Output Summary (Last 24 hours) at 06/12/2019  1225 Last data filed at 06/12/2019 0806 Gross per 24 hour  Intake 1189.92 ml  Output --  Net 1189.92 ml   Filed Weights   06/11/19 1631 06/11/19 2335  Weight: 62.6 kg 65.8 kg    Examination:  General exam: Appears calm and comfortable; reports feeling better.  Still having diarrhea, obstipation and gassy discomfort in her abdomen.  Reports no nausea or vomiting currently.  Patient is afebrile. Respiratory system: Clear to auscultation. Respiratory effort normal. Cardiovascular system: S1 & S2 heard, RRR. No JVD, murmurs, rubs, gallops or clicks. No pedal edema. Gastrointestinal system: Abdomen is soft, no eliciting significant pain on palpation.  Positive bowel sounds, no guarding, nondistended, and without organomegaly or masses felt.  Central nervous system: Alert and oriented. No focal neurological deficits. Extremities: Symmetric 5 x 5 power. Skin: No rashes, no petechiae. Psychiatry: Judgement and insight appear normal. Mood & affect appropriate.     Data Reviewed: I have personally reviewed following labs and imaging studies  CBC: Recent Labs  Lab 06/11/19 1824 06/12/19 0737  WBC 8.5 6.4  NEUTROABS 5.0 3.8  HGB 16.0* 12.6  HCT 47.0* 37.7  MCV 91.8 94.3  PLT 279 564    Basic Metabolic Panel: Recent Labs  Lab 06/11/19 1824 06/12/19 0737  NA 134* 141  K 2.3* 3.3*  CL 92* 110  CO2 27 25  GLUCOSE 113* 129*  BUN 19 11  CREATININE 0.82 0.59  CALCIUM 9.4 8.2*  MG 1.7  --     GFR: Estimated Creatinine Clearance: 64 mL/min (by C-G formula based on SCr of 0.59 mg/dL).  Liver Function Tests: Recent Labs  Lab 06/11/19 1824  AST 21  ALT 22  ALKPHOS 125  BILITOT 0.6  PROT 7.1  ALBUMIN 3.3*    CBG: No results for input(s): GLUCAP in the last 168 hours.   Recent Results (from the past 240 hour(s))  SARS Coronavirus 2 by RT PCR (hospital order, performed in [facility] hospital lab) Nasopharyngeal Nasopharyngeal Swab     Status: None   Collection Time:  06/11/19  9:32 PM   Specimen: Nasopharyngeal Swab  Result Value Ref Range Status   SARS Coronavirus 2 NEGATIVE NEGATIVE Final    Comment: (NOTE) SARS-CoV-2 target nucleic acids are NOT DETECTED. The SARS-CoV-2 RNA is generally detectable in upper and lower respiratory specimens during the acute phase of infection. The lowest concentration of SARS-CoV-2 viral copies this assay can detect is 250 copies / mL. A negative result does not preclude SARS-CoV-2 infection and should not be used as the sole basis for treatment or other patient management decisions.  A negative result may occur with improper specimen collection / handling, submission of specimen other than nasopharyngeal swab, presence of viral mutation(s) within the areas targeted by this assay, and inadequate number of viral copies (<250 copies / mL). A negative result must be combined with clinical observations, patient history, and epidemiological information. Fact Sheet for Patients:   StrictlyIdeas.no Fact Sheet for Healthcare Providers: BankingDealers.co.za This test is not yet approved  or cleared  by the [place] and has been authorized for detection and/or diagnosis of SARS-CoV-2 by FDA under an Emergency Use Authorization (EUA).  This EUA will remain in effect (meaning this test can be used) for the duration of the COVID-19 declaration under Section 564(b)(1) of the Act, 21 U.S.C. section 360bbb-3(b)(1), unless the authorization is terminated or revoked sooner. Performed at [facility], [address], [place], [place] [address]   C Difficile Quick Screen w PCR reflex     Status: None   Collection Time: 06/12/19 12:41 AM   Specimen: STOOL  Result Value Ref Range Status   C Diff antigen NEGATIVE NEGATIVE Final   C Diff toxin NEGATIVE NEGATIVE Final   C Diff interpretation No C. difficile detected.  Final    Comment: Performed at [facility], [address] [place], [place] [address]    Radiology Studies: CT ABDOMEN PELVIS W CONTRAST  Result Date: 06/11/2019 CLINICAL DATA:  Left side abdominal pain, flank pain. EXAM: CT ABDOMEN AND PELVIS WITH CONTRAST TECHNIQUE: Multidetector CT imaging of the abdomen and pelvis was performed using the standard protocol following bolus administration of intravenous contrast. CONTRAST:  OMNIPAQUE IOHEXOL 300 MG/ML  SOLN COMPARISON:  09/11/2018 FINDINGS: Lower chest: Moderate-sized hiatal hernia.  No acute abnormality. Hepatobiliary: No focal hepatic abnormality. Gallbladder unremarkable. Pancreas: No focal abnormality or ductal dilatation. Spleen: No focal abnormality.  Normal size. Adrenals/Urinary Tract: No adrenal abnormality. No focal renal abnormality. No stones or hydronephrosis. Urinary bladder is unremarkable. Stomach/Bowel: Wall thickening seen within the cecum and ascending colon compatible with colitis. Left colonic diverticulosis. No evidence of bowel obstruction. Vascular/Lymphatic: No evidence of aneurysm or adenopathy. Reproductive: Prior hysterectomy.  No adnexal masses. Other: No free fluid or free air. Musculoskeletal: No acute bony abnormality. IMPRESSION: Fall thickening within the cecum and ascending colon compatible with colitis. Left colonic diverticulosis. Moderate-sized hiatal hernia. Electronically Signed   By: Charlett Nose M.D.   On: 06/11/2019 20:13    Scheduled Meds: . atorvastatin  40 mg Oral Daily  . estradiol  0.5 mg Oral Daily  . levothyroxine  112 mcg Oral QAC breakfast  . pantoprazole  40 mg Oral Daily  . potassium chloride SA  40 mEq Oral Once   Continuous Infusions: . [START ON 06/13/2019] ciprofloxacin    . metronidazole 500 mg (06/12/19 0622)     LOS: 0 days    Time spent: 30 minutes    Vassie Loll, MD [facility]   To contact the attending provider between 7A-7P or the covering provider during after hours 7P-7A, please log into the web site  [email or web address] and access using universal [place] password for that web site. If you do not have the password, please call the hospital operator.  06/12/2019, 12:25 PM

## 2019-06-13 DIAGNOSIS — E860 Dehydration: Secondary | ICD-10-CM

## 2019-06-13 LAB — BASIC METABOLIC PANEL
Anion gap: 8 (ref 5–15)
BUN: 7 mg/dL — ABNORMAL LOW (ref 8–23)
CO2: 24 mmol/L (ref 22–32)
Calcium: 8.2 mg/dL — ABNORMAL LOW (ref 8.9–10.3)
Chloride: 107 mmol/L (ref 98–111)
Creatinine, Ser: 0.55 mg/dL (ref 0.44–1.00)
GFR calc Af Amer: >60 mL/min (ref >60)
GFR calc non Af Amer: >60 mL/min (ref >60)
Glucose, Bld: 107 mg/dL — ABNORMAL HIGH (ref 70–99)
Potassium: 3 mmol/L — ABNORMAL LOW (ref 3.5–5.1)
Sodium: 139 mmol/L (ref 135–145)

## 2019-06-13 LAB — GASTROINTESTINAL PANEL BY PCR, STOOL (REPLACES STOOL CULTURE)

## 2019-06-13 LAB — MAGNESIUM: Magnesium: 1.5 mg/dL — ABNORMAL LOW (ref 1.7–2.4)

## 2019-06-13 MED ORDER — POTASSIUM CHLORIDE CRYS ER 20 MEQ PO TBCR: 20.0000 meq | EXTENDED_RELEASE_TABLET | Freq: Every day | ORAL | 0 refills | Status: AC

## 2019-06-13 MED ORDER — LOSARTAN POTASSIUM-HCTZ 100-25 MG PO TABS: 1.0000 | ORAL_TABLET | Freq: Every day | ORAL | Status: AC

## 2019-06-13 MED ORDER — MAGNESIUM SULFATE 2 GM/50ML IV SOLN
2.0000 g | Freq: Once | INTRAVENOUS | Status: AC
  Administered 2019-06-13: 2 g via INTRAVENOUS
  Filled 2019-06-13: qty 50

## 2019-06-13 MED ORDER — AZITHROMYCIN 250 MG PO TABS
500.0000 mg | ORAL_TABLET | Freq: Every day | ORAL | Status: DC
  Administered 2019-06-13: 500 mg via ORAL
  Filled 2019-06-13: qty 2

## 2019-06-13 MED ORDER — AZITHROMYCIN 500 MG PO TABS: 500.0000 mg | ORAL_TABLET | Freq: Every day | ORAL | 0 refills | Status: AC

## 2019-06-13 MED ORDER — POTASSIUM CHLORIDE CRYS ER 20 MEQ PO TBCR: 20.0000 meq | EXTENDED_RELEASE_TABLET | Freq: Every day | ORAL | 0 refills | Status: DC

## 2019-06-13 MED ORDER — AZITHROMYCIN 500 MG PO TABS: 500.0000 mg | ORAL_TABLET | Freq: Every day | ORAL | 0 refills | Status: DC

## 2019-06-13 MED ORDER — POTASSIUM CHLORIDE CRYS ER 20 MEQ PO TBCR
40.0000 meq | EXTENDED_RELEASE_TABLET | ORAL | Status: AC
  Administered 2019-06-13 (×2): 40 meq via ORAL
  Filled 2019-06-13 (×2): qty 2

## 2019-06-13 NOTE — Discharge Summary (Signed)
Physician Discharge Summary  Claire Knight OLM:[id number] DOB: 1955-05-03 DOA: 06/11/2019  PCP: Isabella Bowens, MD  Admit date: 06/11/2019 Discharge date: 06/13/2019  Time spent: 35 minutes  Recommendations for Outpatient Follow-up:  1. Repeat CBC to follow hemoglobin trend 2. Repeat basic metabolic panel to follow electrolytes and renal function 3. Reassess blood pressure and adjust antihypertensive regimen as needed.   Discharge Diagnoses:     Acute Campylobacter enteritis   Hypertension   GERD (gastroesophageal reflux disease)   Hypothyroidism   Polycythemia   Hypokalemia   Hyperlipidemia   Dehydration   Discharge Condition: Stable and improved.  Patient discharged home with instruction to follow-up with PCP in 10 days.  CODE STATUS: Full code  Diet recommendation: Heart healthy diet  Filed Weights   06/11/19 1631 06/11/19 2335  Weight: 62.6 kg 65.8 kg    History of present illness:  As per [name] on 06/11/2019 64 y.o.femalewith medical history significant ofulcer colitis, GERD, hypertension, hyperlipidemia, history of nonhemorrhagic stroke with no residual deficits found on MRI, hypothyroidism who is coming to the emergency department due to diarrhea for5 daysassociated with an 8 pound weight loss, fatigue, lightheadedness, palpitations, nausea, but no emesisafter she ate some chicken and always out for a while.Her husband and daughter ate from it as well, but the husband only ate a small amount and did not get any symptoms.Her daughter hadsome epigastricabdominal discomfort, but she did not eat as much chicken ice the patient did.She denies fever, chills, constipation, melena or hematochezia. No dysuria, frequency or hematuria. No sore throat, rhinorrhea, dyspnea, wheezing or hemoptysis. No chest pain, palpitations, diaphoresis, PND, orthopnea or pitting edema of the lower extremities.   ED Course:Initial vital signs temperature  98.1 F, pulse 105, respirations 15, blood pressure 111/67 mmHg and O2 sat 99% on room air.She received 2000 mL of NS bolus, Zofran 4 mg IVP, ciprofloxacin 400 mg IV, metronidazole 500 mg IV, magnesium sulfate 2 g IVPB, 40 mEq of KCl orally and 3 bags of KCl 10 mEq.  Urinalysis was hazy with small hemoglobinuria, ketonuria 20 mg/dL and rare bacteria on microscopic examination. CBC shows a white count of 8.5, hemoglobin 16.0 g/dL and platelets 010. 9 months ago her baseline hemoglobin was around 11+ gr/dL. Lipase was 23. CMP shows a sodium 134, potassium 2.3 and chloride 92 mmol/L. CO2 and calcium were within normal limits. Renal function was normal. Glucose 113 mg/dL. LFTs were within expected range, except for an albumin level of 3.3 g/dL.  Hospital Course:  1-Acute Campylobacter enteritis. -Reports improvement in abdominal discomfort and just 2 loose stools in the last 24 hours.  Feeling ready to go home. -C. Diff neg -GI panel positive for Campylobacter -Patient will be treated with 3 days of azithromycin 500 mg daily. -Advised to maintain adequate oral hydration -Tolerating diet without problems prior to discharge.  2-Hypertension -Blood pressure stable -Advised to follow heart healthy diet -Resumption of Hyzaar in 2 days. -Reassess blood pressure at follow-up visit with PCP and further adjust antihypertensive regimen as needed.  3-GERD (gastroesophageal reflux disease) -Continue PPI.  4-Hypothyroidism -Continue Synthroid.  5-dehydration/hemoconcentration/Polycythemia -Improved with fluids -Repeat CBC at follow-up visit to reassess hemoglobin stability.  6-Hypokalemia -continue to replete  -Magnesium level 1.5 and repleted prior to discharge. -K up to 3.0; 40 mEq given prior to discharge and 5 days of 20 mEq for maintenance will be provided. -in the setting of GI loses -Repeat basic metabolic panel to reassess electrolytes renal follow-up  visit.  7-Hyperlipidemia -continue statins.  Procedures: See below for x-ray reports.  Consultations:  None  Discharge Exam: Vitals:   06/12/19 2114 06/13/19 0550  BP: 119/74 (!) 101/54  Pulse: 81 70  Resp: 16 20  Temp: 98 F (36.7 C) 98.6 F (37 C)  SpO2: 98% 100%    General: Afebrile, no chest pain, no nausea or vomiting.  Still having some mild intermittent abdominal discomfort and loose stools; but much improved and feeling ready to go home. Cardiovascular: S1 and S2, no rubs, no gallops, no JVD. Respiratory: Clear to auscultation bilaterally. Abdomen: Soft, no guarding, positive bowel sounds. Extremities: No cyanosis or clubbing.  Discharge Instructions   Discharge Instructions    Diet - low sodium heart healthy   Complete by: As directed    Discharge instructions   Complete by: As directed    Take medications as prescribed Maintain adequate hydration Follow-up with PCP in 10 days Follow heart healthy diet.   Increase activity slowly   Complete by: As directed      Allergies as of 06/13/2019      Reactions   Clindamycin/lincomycin Other (See Comments)   Body aches    Sulfa Antibiotics Other (See Comments)   Hallucinations      Medication List    STOP taking these medications   praziquantel 600 MG tablet Commonly known as: BILTRICIDE     TAKE these medications   atorvastatin 40 MG tablet Commonly known as: LIPITOR Take 40 mg by mouth daily.   azithromycin 500 MG tablet Commonly known as: ZITHROMAX Take 1 tablet (500 mg total) by mouth daily for 3 days.   estradiol 0.5 MG tablet Commonly known as: ESTRACE Take 0.5 mg by mouth daily.   levothyroxine 112 MCG tablet Commonly known as: SYNTHROID Take 112 mcg by mouth daily before breakfast.   losartan-hydrochlorothiazide 100-25 MG tablet Commonly known as: HYZAAR Take 1 tablet by mouth daily. Start taking on: Jun 15, 2019 What changed: These instructions start on Jun 15, 2019. If you  are unsure what to do until then, ask your doctor or other care provider.   multivitamin with minerals Tabs tablet Take 1 tablet by mouth daily.   omeprazole 20 MG capsule Commonly known as: PRILOSEC Take 20 mg by mouth daily.   potassium chloride SA 20 MEQ tablet Commonly known as: KLOR-CON Take 1 tablet (20 mEq total) by mouth daily for 5 days.   VITAMIN B12 PO Take 1 tablet by mouth daily.   VITAMIN D (CHOLECALCIFEROL) PO Take 1 capsule by mouth daily.      Allergies  Allergen Reactions  . Clindamycin/Lincomycin Other (See Comments)    Body aches   . Sulfa Antibiotics Other (See Comments)    Hallucinations   Follow-up Information    Vasireddy, Lanetta Inch, MD. Schedule an appointment as soon as possible for a visit in 10 day(s).   Specialty: Internal Medicine Contact information: [place] [address] [phone]           The results of significant diagnostics from this hospitalization (including imaging, microbiology, ancillary and laboratory) are listed below for reference.    Significant Diagnostic Studies: CT ABDOMEN PELVIS W CONTRAST  Result Date: 06/11/2019 CLINICAL DATA:  Left side abdominal pain, flank pain. EXAM: CT ABDOMEN AND PELVIS WITH CONTRAST TECHNIQUE: Multidetector CT imaging of the abdomen and pelvis was performed using the standard protocol following bolus administration of intravenous contrast. CONTRAST:  162mL OMNIPAQUE IOHEXOL 300 MG/ML  SOLN COMPARISON:  09/11/2018  FINDINGS: Lower chest: Moderate-sized hiatal hernia.  No acute abnormality. Hepatobiliary: No focal hepatic abnormality. Gallbladder unremarkable. Pancreas: No focal abnormality or ductal dilatation. Spleen: No focal abnormality.  Normal size. Adrenals/Urinary Tract: No adrenal abnormality. No focal renal abnormality. No stones or hydronephrosis. Urinary bladder is unremarkable. Stomach/Bowel: Wall thickening seen within the cecum and ascending colon compatible  with colitis. Left colonic diverticulosis. No evidence of bowel obstruction. Vascular/Lymphatic: No evidence of aneurysm or adenopathy. Reproductive: Prior hysterectomy.  No adnexal masses. Other: No free fluid or free air. Musculoskeletal: No acute bony abnormality. IMPRESSION: Fall thickening within the cecum and ascending colon compatible with colitis. Left colonic diverticulosis. Moderate-sized hiatal hernia. Electronically Signed   By: Charlett Nose M.D.   On: 06/11/2019 20:13    Microbiology: Recent Results (from the past 240 hour(s))  SARS Coronavirus 2 by RT PCR (hospital order, performed in [facility] hospital lab) Nasopharyngeal Nasopharyngeal Swab     Status: None   Collection Time: 06/11/19  9:32 PM   Specimen: Nasopharyngeal Swab  Result Value Ref Range Status   SARS Coronavirus 2 NEGATIVE NEGATIVE Final    Comment: (NOTE) SARS-CoV-2 target nucleic acids are NOT DETECTED. The SARS-CoV-2 RNA is generally detectable in upper and lower respiratory specimens during the acute phase of infection. The lowest concentration of SARS-CoV-2 viral copies this assay can detect is 250 copies / mL. A negative result does not preclude SARS-CoV-2 infection and should not be used as the sole basis for treatment or other patient management decisions.  A negative result may occur with improper specimen collection / handling, submission of specimen other than nasopharyngeal swab, presence of viral mutation(s) within the areas targeted by this assay, and inadequate number of viral copies (<250 copies / mL). A negative result must be combined with clinical observations, patient history, and epidemiological information. Fact Sheet for Patients:   BoilerBrush.com.cy Fact Sheet for Healthcare Providers: https://pope.com/ This test is not yet approved or cleared  by the [place] FDA and has been authorized for detection and/or diagnosis of SARS-CoV-2  by FDA under an Emergency Use Authorization (EUA).  This EUA will remain in effect (meaning this test can be used) for the duration of the COVID-19 declaration under Section 564(b)(1) of the Act, 21 U.S.C. section 360bbb-3(b)(1), unless the authorization is terminated or revoked sooner. Performed at [facility], [address] [place], [place] [address]   C Difficile Quick Screen w PCR reflex     Status: None   Collection Time: 06/12/19 12:41 AM   Specimen: STOOL  Result Value Ref Range Status   C Diff antigen NEGATIVE NEGATIVE Final   C Diff toxin NEGATIVE NEGATIVE Final   C Diff interpretation No C. difficile detected.  Final    Comment: Performed at [facility], [address], [place], [place] [address]  Gastrointestinal Panel by PCR , Stool     Status: Abnormal   Collection Time: 06/12/19 12:41 AM   Specimen: Stool  Result Value Ref Range Status   Campylobacter species DETECTED (A) NOT DETECTED Final    Comment: RESULT CALLED TO, READ BACK BY AND VERIFIED WITHTonna Boehringer AT [id number] 06/13/19.PMF    Plesimonas shigelloides NOT DETECTED NOT DETECTED Final   Salmonella species NOT DETECTED NOT DETECTED Final   Yersinia enterocolitica NOT DETECTED NOT DETECTED Final   Vibrio species NOT DETECTED NOT DETECTED Final   Vibrio cholerae NOT DETECTED NOT DETECTED Final   Enteroaggregative E coli (EAEC) NOT DETECTED NOT DETECTED Final   Enteropathogenic  E coli (EPEC) NOT DETECTED NOT DETECTED Final   Enterotoxigenic E coli (ETEC) NOT DETECTED NOT DETECTED Final   Shiga like toxin producing E coli (STEC) NOT DETECTED NOT DETECTED Final   Shigella/Enteroinvasive E coli (EIEC) NOT DETECTED NOT DETECTED Final   Cryptosporidium NOT DETECTED NOT DETECTED Final   Cyclospora cayetanensis NOT DETECTED NOT DETECTED Final   Entamoeba histolytica NOT DETECTED NOT DETECTED Final   Giardia lamblia NOT DETECTED NOT DETECTED Final   Adenovirus F40/41 NOT DETECTED NOT DETECTED Final   Astrovirus NOT  DETECTED NOT DETECTED Final   Norovirus GI/GII NOT DETECTED NOT DETECTED Final   Rotavirus A NOT DETECTED NOT DETECTED Final   Sapovirus (I, II, IV, and V) NOT DETECTED NOT DETECTED Final    Comment: Performed at [facility], [address] [place], [place] [address]     Labs: Basic Metabolic Panel: Recent Labs  Lab 06/11/19 1824 06/12/19 0737 06/13/19 0626  NA 134* 141 139  K 2.3* 3.3* 3.0*  CL 92* 110 107  CO2 27 25 24   GLUCOSE 113* 129* 107*  BUN 19 11 7*  CREATININE 0.82 0.59 0.55  CALCIUM 9.4 8.2* 8.2*  MG 1.7  --  1.5*   Liver Function Tests: Recent Labs  Lab 06/11/19 1824  AST 21  ALT 22  ALKPHOS 125  BILITOT 0.6  PROT 7.1  ALBUMIN 3.3*   Recent Labs  Lab 06/11/19 1824  LIPASE 23   CBC: Recent Labs  Lab 06/11/19 1824 06/12/19 0737  WBC 8.5 6.4  NEUTROABS 5.0 3.8  HGB 16.0* 12.6  HCT 47.0* 37.7  MCV 91.8 94.3  PLT 279 237    Signed:  06/14/19 MD.  [facility] 06/13/2019, 12:30 PM

## 2019-06-13 NOTE — Progress Notes (Signed)
IV removed and discharge instructions reviewed.  Printed scripts given.  Husband to drive home

## 2019-12-01 ENCOUNTER — Encounter (HOSPITAL_COMMUNITY): Payer: Self-pay

## 2019-12-01 ENCOUNTER — Other Ambulatory Visit: Payer: Self-pay

## 2019-12-01 ENCOUNTER — Emergency Department (HOSPITAL_COMMUNITY)
Admission: EM | Admit: 2019-12-01 | Discharge: 2019-12-01 | Disposition: A | Discharge status: Home / Self Care | Payer: No Typology Code available for payment source | Attending: Emergency Medicine | Admitting: Emergency Medicine

## 2019-12-01 DIAGNOSIS — U071 COVID-19: Secondary | ICD-10-CM | POA: Insufficient documentation

## 2019-12-01 DIAGNOSIS — E039 Hypothyroidism, unspecified: Secondary | ICD-10-CM | POA: Diagnosis not present

## 2019-12-01 DIAGNOSIS — I10 Essential (primary) hypertension: Secondary | ICD-10-CM | POA: Insufficient documentation

## 2019-12-01 DIAGNOSIS — Z79899 Other long term (current) drug therapy: Secondary | ICD-10-CM | POA: Diagnosis not present

## 2019-12-01 DIAGNOSIS — R0602 Shortness of breath: Secondary | ICD-10-CM | POA: Diagnosis present

## 2019-12-01 MED ORDER — FAMOTIDINE IN NACL 20-0.9 MG/50ML-% IV SOLN: 20.0000 mg | Freq: Once | INTRAVENOUS | Status: DC | PRN

## 2019-12-01 MED ORDER — ALBUTEROL SULFATE HFA 108 (90 BASE) MCG/ACT IN AERS: 2.0000 | INHALATION_SPRAY | Freq: Once | RESPIRATORY_TRACT | Status: DC | PRN

## 2019-12-01 MED ORDER — METHYLPREDNISOLONE SODIUM SUCC 125 MG IJ SOLR: 125.0000 mg | Freq: Once | INTRAMUSCULAR | Status: DC | PRN

## 2019-12-01 MED ORDER — ONDANSETRON 4 MG PO TBDP
4.0000 mg | ORAL_TABLET | Freq: Once | ORAL | Status: AC
  Administered 2019-12-01: 4 mg via ORAL
  Filled 2019-12-01: qty 1

## 2019-12-01 MED ORDER — SODIUM CHLORIDE 0.9 % IV SOLN: INTRAVENOUS | Status: DC | PRN

## 2019-12-01 MED ORDER — EPINEPHRINE 0.3 MG/0.3ML IJ SOAJ: 0.3000 mg | Freq: Once | INTRAMUSCULAR | Status: DC | PRN

## 2019-12-01 MED ORDER — DIPHENHYDRAMINE HCL 50 MG/ML IJ SOLN: 50.0000 mg | Freq: Once | INTRAMUSCULAR | Status: DC | PRN

## 2019-12-01 MED ORDER — SODIUM CHLORIDE 0.9 % IV SOLN
1200.0000 mg | Freq: Once | INTRAVENOUS | Status: AC
  Administered 2019-12-01: 1200 mg via INTRAVENOUS
  Filled 2019-12-01: qty 10

## 2019-12-01 NOTE — Progress Notes (Signed)
Pharmacy COVID-19 Monoclonal Antibody Screening  Claire Knight was identified as being not hospitalized with symptoms from Covid-19 on admission but an incidental positive PCR has been documented.  The patient may qualify for the use of monoclonal antibodies (mAB) for COVID-19 viral infection to prevent worsening symptoms stemming from Covid-19 infection.  The patient was identified based on a positive COVID-19 PCR and not requiring the use of supplemental oxygen at this time.  This patient meets the FDA criteria for Emergency Use Authorization of casirivimab/imdevimab or bamlanivimab/etesevimab.  Has a (+) direct SARS-CoV-2 viral test result  Is NOT hospitalized due to COVID-19  Is within 10 days of symptom onset  Has at least one of the high risk factor(s) for progression to severe COVID-19 and/or hospitalization as defined in EUA.  Specific high risk criteria : BMI > 25  Additionally: The patient has had a positive COVID-19 PCR in the last 90 days.  The patient is unvaccinated against COVID-19.  Since the patient is unvaccinated and meets high risk criteria, the patient is eligible for mAB administration.     Plan: Based on the above discussion, it was decided that the patient will receive one dose of the available COVID-19 mAB combination. Pharmacy will coordinate administration timing with patient's nurse. Recommended infusion monitoring parameters communicated to the nursing team.   Tad Moore 12/01/2019  3:27 PM

## 2019-12-01 NOTE — ED Provider Notes (Signed)
Claire Knight   CSN: [id number] Arrival date & time: 12/01/19  1441     History Chief Complaint  Patient presents with  . Diarrhea    Claire Knight is a 64 y.o. female.  Pt was seen at drive thru clinic and diagnosed with covid.  Pt reports her husband had the mAb treatment and got better quickly.  Pt has tried to get into clinic but has been unsuccessful.  Pt reports slight cough and shortness of breath. No fever  The history is provided by the patient. No language interpreter was used.       Past Medical History:  Diagnosis Date  . Arthritis   . GERD (gastroesophageal reflux disease)   . Hyperlipidemia   . Hypertension   . Stroke [facility])    found old stroke on MRI recently. no deficets   . Thyroid disease     Patient Active Problem List   Diagnosis Date Noted  . Dehydration   . Colitis 06/12/2019  . Acute colitis 06/11/2019  . Hypertension   . GERD (gastroesophageal reflux disease)   . Hypothyroidism   . Polycythemia   . Hypokalemia   . Hyperlipidemia   . Hematoma of groin 10/01/2018  . Incarcerated left inguinal hernia   . SBO (small bowel obstruction) (HCC)     Past Surgical History:  Procedure Laterality Date  . ABDOMINAL HYSTERECTOMY    . BOWEL RESECTION  09/11/2018   Procedure: EXPLORATORY LAPAROTOMY; SMALL BOWEL RESECTION;  Surgeon: Lucretia Roers, MD;  Location: [facility];  Service: General;;  . INGUINAL HERNIA REPAIR Left 09/11/2018   Procedure: LEFT INCARCERATED INGUINAL HERNIA REPAIR ;  Surgeon: Lucretia Roers, MD;  Location: [facility];  Service: General;  Laterality: Left;  . PLACEMENT OF BREAST IMPLANTS    . ROBOTIC ASSISTED LAPAROSCOPIC SACROCOLPOPEXY  11/2017     OB History   No obstetric history on file.     Family History  Problem Relation Age of Onset  . CAD Father   . Heart failure Father   . Breast cancer Sister   . Brain cancer Brother     Social History   Tobacco Use  . Smoking  status: Never Smoker  . Smokeless tobacco: Never Used  Substance Use Topics  . Alcohol use: Yes    Comment: occ  . Drug use: Never    Home Medications Prior to Admission medications   Medication Sig Start Date End Date Taking? Authorizing Provider  acetaminophen (TYLENOL) 500 MG tablet Take 500 mg by mouth every 6 (six) hours as needed.   Yes [provider]  atorvastatin (LIPITOR) 40 MG tablet Take 40 mg by mouth daily. 07/18/18  Yes [provider]  Cyanocobalamin (VITAMIN B12 PO) Take 1 tablet by mouth daily.   Yes [provider]  estradiol (ESTRACE) 0.5 MG tablet Take 0.5 mg by mouth daily.   Yes [provider]  levothyroxine (SYNTHROID) 112 MCG tablet Take 112 mcg by mouth daily before breakfast.   Yes [provider]  losartan-hydrochlorothiazide (HYZAAR) 100-25 MG tablet Take 1 tablet by mouth daily. 06/15/19  Yes Vassie Loll, MD  Multiple Vitamin (MULTIVITAMIN WITH MINERALS) TABS tablet Take 1 tablet by mouth daily.   Yes [provider]  omeprazole (PRILOSEC) 20 MG capsule Take 20 mg by mouth daily.   Yes [provider]  VITAMIN D, CHOLECALCIFEROL, PO Take 1 capsule by mouth daily.   Yes [provider]  Potassium Chloride  ER 20 MEQ TBCR Take 1 tablet by mouth daily. Patient not taking: Reported on 12/01/2019 06/13/19   [provider]  potassium chloride SA (KLOR-CON) 20 MEQ tablet Take 1 tablet (20 mEq total) by mouth daily for 5 days. Patient not taking: Reported on 12/01/2019 06/13/19 06/18/19  Vassie Loll, MD  praziquantel (BILTRICIDE) 600 MG tablet Take 1 tablet by mouth every 28 (twenty-eight) days. Patient not taking: Reported on 12/01/2019 06/14/19   [provider]    Allergies    Clindamycin/lincomycin and Sulfa antibiotics  Review of Systems   Review of Systems  All other systems reviewed and are negative.   Physical Exam Updated Vital Signs BP 119/70   Pulse 77    Temp 97.6 F (36.4 C) (Oral)   Resp 12   Ht 5' 2.5" (1.588 m)   Wt 63.5 kg   SpO2 97%   BMI 25.20 kg/m   Physical Exam Vitals and nursing Knight reviewed.  Constitutional:      Appearance: She is well-developed.  HENT:     Head: Normocephalic.  Cardiovascular:     Rate and Rhythm: Normal rate.  Pulmonary:     Effort: Pulmonary effort is normal.  Abdominal:     General: There is no distension.  Musculoskeletal:        General: Normal range of motion.     Cervical back: Normal range of motion.  Neurological:     Mental Status: She is alert and oriented to person, place, and time.     ED Results / Procedures / Treatments   Labs (all labs ordered are listed, but only abnormal results are displayed) Labs Reviewed - No data to display  EKG None  Radiology No results found.  Procedures Procedures (including critical care time)  Medications Ordered in ED Medications  0.9 %  sodium chloride infusion (has no administration in time range)  diphenhydrAMINE (BENADRYL) injection 50 mg (has no administration in time range)  famotidine (PEPCID) IVPB 20 mg premix (has no administration in time range)  methylPREDNISolone sodium succinate (SOLU-MEDROL) 125 mg/2 mL injection 125 mg (has no administration in time range)  albuterol (VENTOLIN HFA) 108 (90 Base) MCG/ACT inhaler 2 puff (has no administration in time range)  EPINEPHrine (EPI-PEN) injection 0.3 mg (has no administration in time range)  casirivimab-imdevimab (REGEN-COV) 1,200 mg in sodium chloride 0.9 % 110 mL IVPB (0 mg Intravenous Stopped 12/01/19 1704)    ED Course  I have reviewed the triage vital signs and the nursing notes.  Pertinent labs & imaging results that were available during my care of the patient were reviewed by me and considered in my medical decision making (see chart for details).    MDM Rules/Calculators/A&P                          MDM:  Pt given MAB.  Pt advised to follow up with her Physicain  as needed. Final Clinical Impression(s) / ED Diagnoses Final diagnoses:  COVID    Rx / DC Orders ED Discharge Orders    None    An After Visit Summary was printed and given to the patient.    Elson Areas, Cordelia Poche 12/01/19 Darlys Gales, MD 12/01/19 [phone]

## 2019-12-01 NOTE — ED Notes (Signed)
No adverse effects from infusion. Verbalized understanding of d/c instructions.

## 2019-12-01 NOTE — ED Triage Notes (Signed)
Pt brought to ED via [facility]. Pt is Covid Positive. Pt has been having diarrhea, 3 episodes a day. Pt tested positive on Monday, symptoms began last Tuesday. Pt is wanting monoclonial antibodies. Pt was unable to set it up through her PCP, states her PCP never called her back.

## 2019-12-01 NOTE — Discharge Instructions (Signed)
Return if any problems.

## 2020-09-15 IMAGING — CT CT ABD-PELV W/ CM
2 of 5 series · 17 of 46 positions shown, 19 images · IV contrast (Omnipaque or Isovue)
Comparison: 09/11/2018

CLINICAL DATA: Left side abdominal pain, flank pain.

EXAM:
CT ABDOMEN AND PELVIS WITH CONTRAST
TECHNIQUE: Multidetector CT imaging of the abdomen and pelvis was performed
using the standard protocol following bolus administration of
intravenous contrast.
CONTRAST:  100mL OMNIPAQUE IOHEXOL 300 MG/ML  SOLN

[Series 2: axial st · axial · 0.69mm/px · z∈[+738,+1104]mm · 14 of 83 slices shown, 16 images]
[im 5/83  soft-tissue]
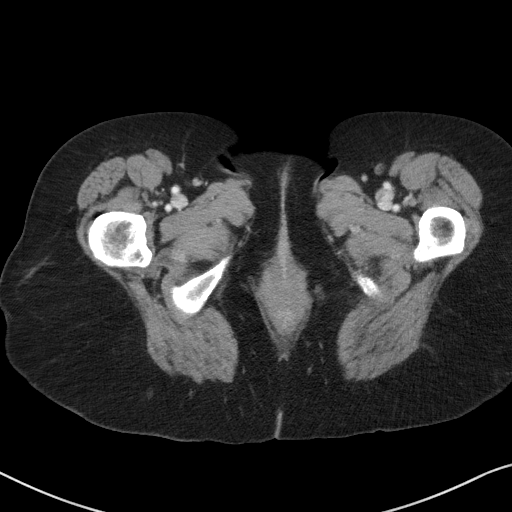
[im 5/83  bone]
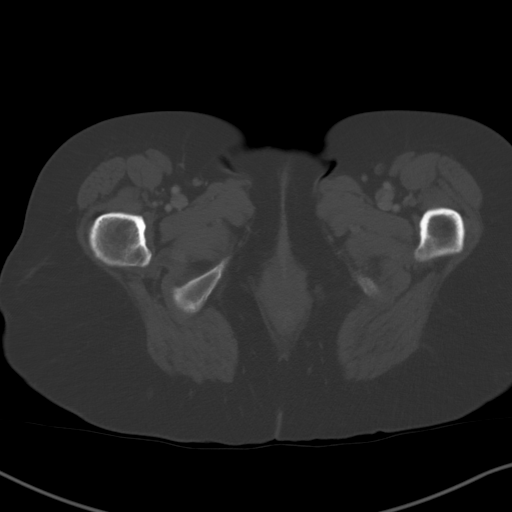
[im 10/83  soft-tissue]
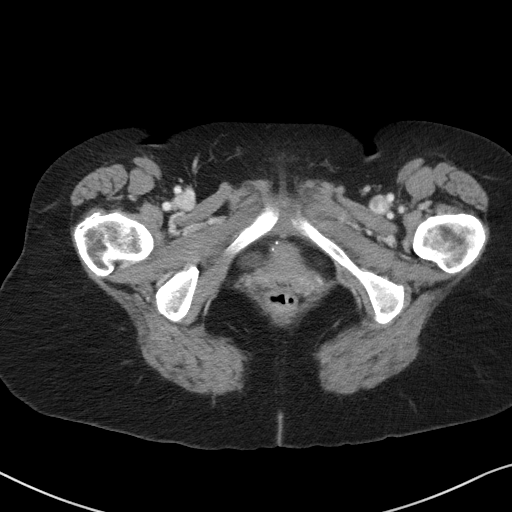
[im 15/83  soft-tissue]
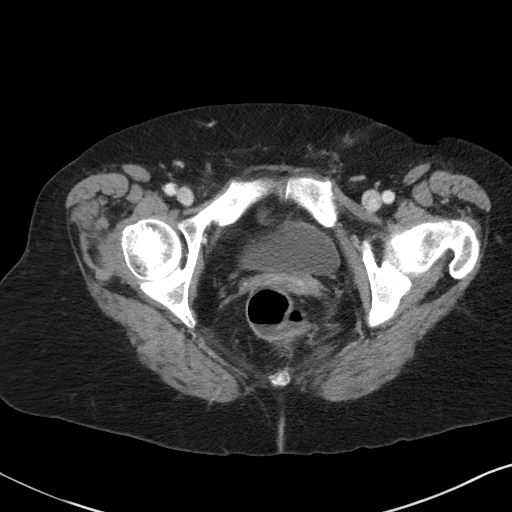
[im 25/83  soft-tissue]
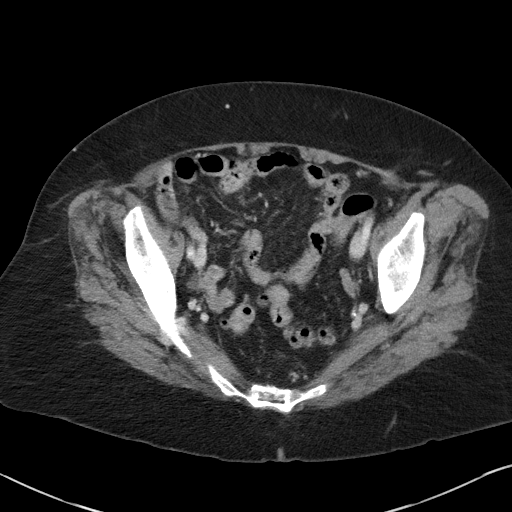
[im 29/83  soft-tissue]
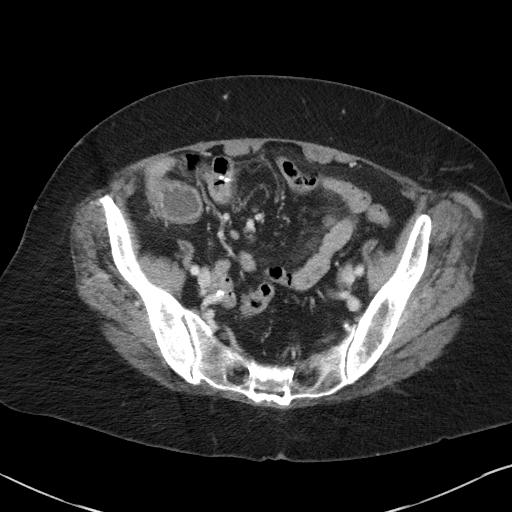
[im 34/83  soft-tissue]
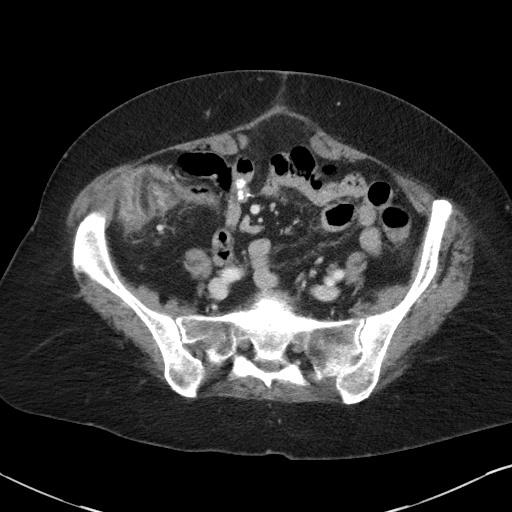
[im 39/83  soft-tissue]
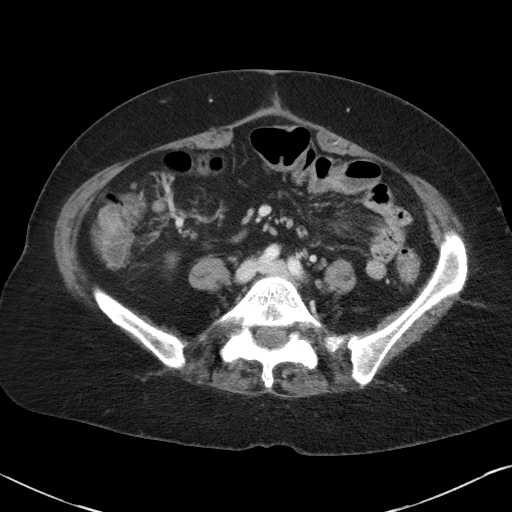
[im 44/83  soft-tissue]
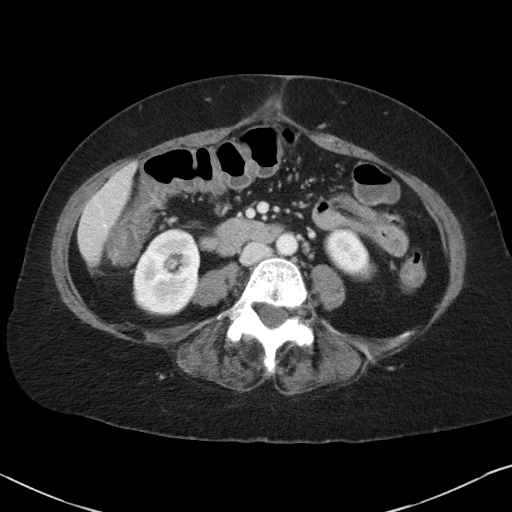
[im 49/83  soft-tissue]
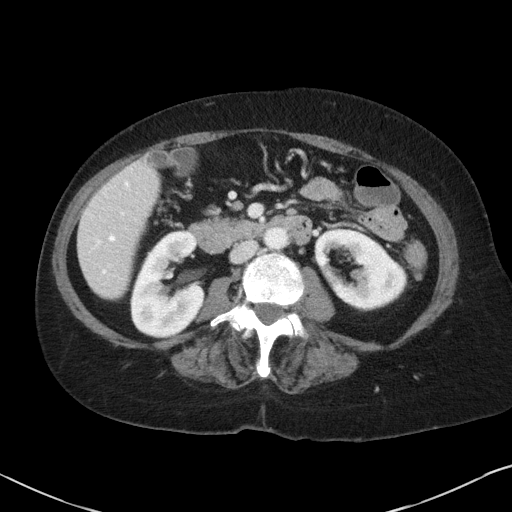
[im 49/83  bone]
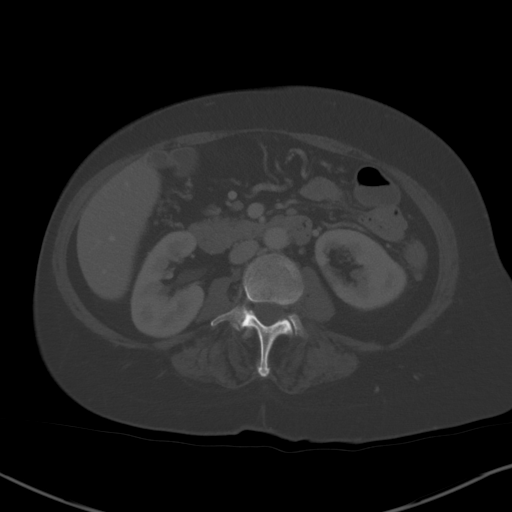
[im 54/83  soft-tissue]
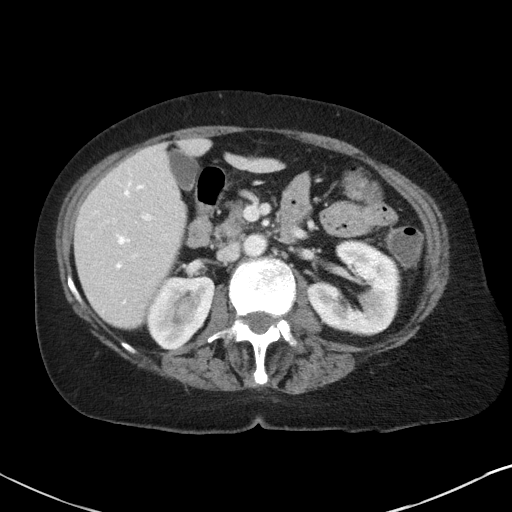
[im 63/83  soft-tissue]
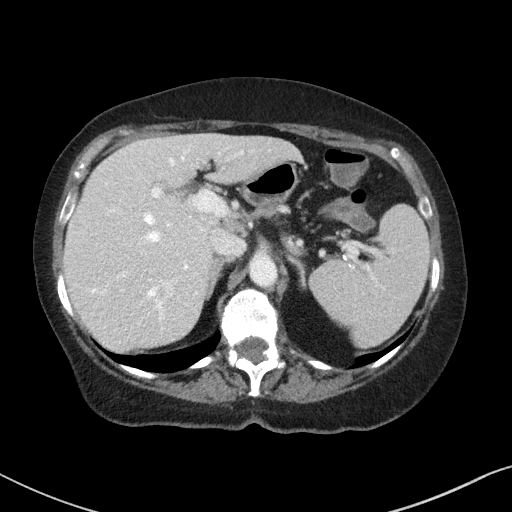
[im 68/83  soft-tissue]
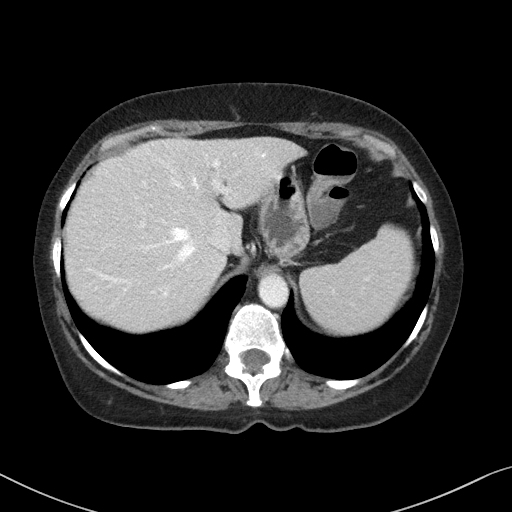
[im 73/83  soft-tissue]
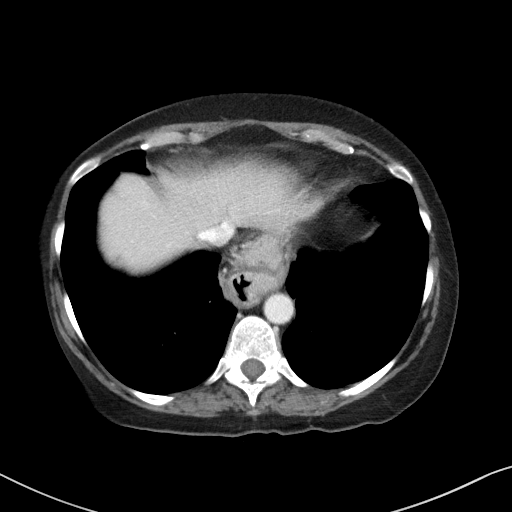
[im 78/83  soft-tissue]
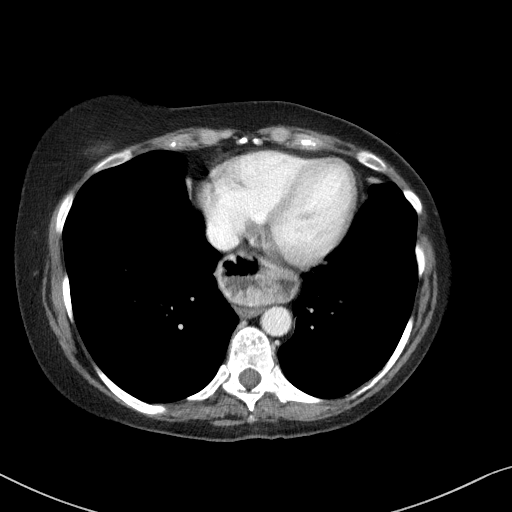

[Series 5: coronal st · coronal · 0.72mm/px · 3 of 99 slices shown]
[im 33/99  soft-tissue]
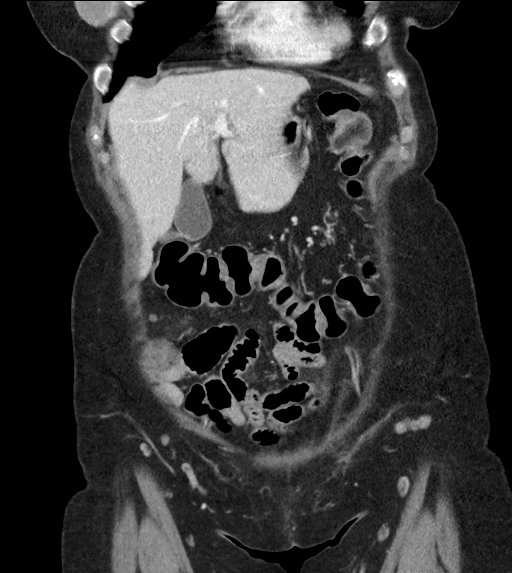
[im 44/99  soft-tissue]
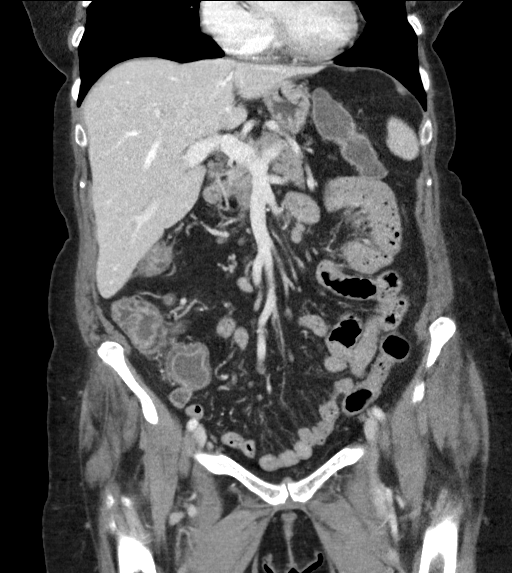
[im 55/99  soft-tissue]
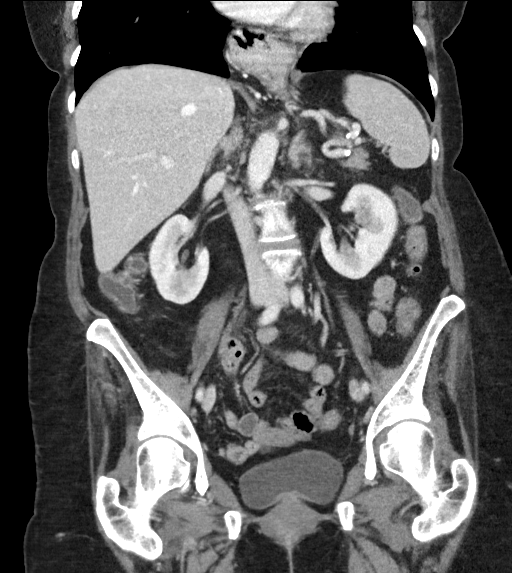

[17 of 46 positions shown; findings below may reference images not displayed]

FINDINGS: Lower chest: Moderate-sized hiatal hernia.  No acute abnormality.

Hepatobiliary: No focal hepatic abnormality. Gallbladder
unremarkable.

Pancreas: No focal abnormality or ductal dilatation.

Spleen: No focal abnormality.  Normal size.

Adrenals/Urinary Tract: No adrenal abnormality. No focal renal
abnormality. No stones or hydronephrosis. Urinary bladder is
unremarkable.

Stomach/Bowel: Wall thickening seen within the cecum and ascending
colon compatible with colitis. Left colonic diverticulosis. No
evidence of bowel obstruction.

Vascular/Lymphatic: No evidence of aneurysm or adenopathy.

Reproductive: Prior hysterectomy.  No adnexal masses.

Other: No free fluid or free air.

Musculoskeletal: No acute bony abnormality.
IMPRESSION: Fall thickening within the cecum and ascending colon compatible with
colitis.

Left colonic diverticulosis.

Moderate-sized hiatal hernia.
